# Patient Record
Sex: Female | Born: 1960
Health system: Southern US, Community
[De-identification: ages and names within clinical notes are randomized; demographics above are authoritative.]

## PROBLEM LIST (undated history)

## (undated) DIAGNOSIS — E7800 Pure hypercholesterolemia, unspecified: Secondary | ICD-10-CM

## (undated) DIAGNOSIS — Z860101 Personal history of adenomatous and serrated colon polyps: Secondary | ICD-10-CM

## (undated) DIAGNOSIS — F411 Generalized anxiety disorder: Secondary | ICD-10-CM

## (undated) DIAGNOSIS — R030 Elevated blood-pressure reading, without diagnosis of hypertension: Secondary | ICD-10-CM

## (undated) DIAGNOSIS — R1032 Left lower quadrant pain: Secondary | ICD-10-CM

## (undated) DIAGNOSIS — G479 Sleep disorder, unspecified: Secondary | ICD-10-CM

## (undated) DIAGNOSIS — F4321 Adjustment disorder with depressed mood: Secondary | ICD-10-CM

## (undated) DIAGNOSIS — E611 Iron deficiency: Secondary | ICD-10-CM

## (undated) DIAGNOSIS — Z86010 Personal history of colonic polyps: Secondary | ICD-10-CM

## (undated) DIAGNOSIS — E785 Hyperlipidemia, unspecified: Secondary | ICD-10-CM

## (undated) DIAGNOSIS — I10 Essential (primary) hypertension: Secondary | ICD-10-CM

## (undated) DIAGNOSIS — D649 Anemia, unspecified: Secondary | ICD-10-CM

## (undated) DIAGNOSIS — F4320 Adjustment disorder, unspecified: Secondary | ICD-10-CM

## (undated) DIAGNOSIS — E559 Vitamin D deficiency, unspecified: Secondary | ICD-10-CM

## (undated) DIAGNOSIS — R51 Headache: Secondary | ICD-10-CM

## (undated) DIAGNOSIS — F331 Major depressive disorder, recurrent, moderate: Secondary | ICD-10-CM

## (undated) DIAGNOSIS — F419 Anxiety disorder, unspecified: Secondary | ICD-10-CM

## (undated) DIAGNOSIS — K219 Gastro-esophageal reflux disease without esophagitis: Secondary | ICD-10-CM

## (undated) DIAGNOSIS — G2581 Restless legs syndrome: Secondary | ICD-10-CM

## (undated) DIAGNOSIS — Z973 Presence of spectacles and contact lenses: Secondary | ICD-10-CM

## (undated) HISTORY — PX: ABDOMINAL HYSTERECTOMY: SHX81

## (undated) HISTORY — DX: Generalized anxiety disorder: F41.1

## (undated) HISTORY — PX: COLONOSCOPY: SHX174

## (undated) HISTORY — DX: Essential (primary) hypertension: I10

## (undated) HISTORY — DX: Vitamin D deficiency, unspecified: E55.9

## (undated) HISTORY — DX: Pure hypercholesterolemia, unspecified: E78.00

## (undated) HISTORY — DX: Sleep disorder, unspecified: G47.9

## (undated) HISTORY — DX: Major depressive disorder, recurrent, moderate: F33.1

## (undated) HISTORY — DX: Adjustment disorder with depressed mood: F43.21

## (undated) HISTORY — DX: Adjustment disorder, unspecified: F43.20

---

## 1986-01-24 HISTORY — PX: DILATION AND CURETTAGE OF UTERUS: SHX78

## 1999-10-25 ENCOUNTER — Ambulatory Visit (HOSPITAL_COMMUNITY): Admission: RE | Admit: 1999-10-25 | Discharge: 1999-10-25 | Payer: Self-pay | Admitting: Gastroenterology

## 2001-05-28 ENCOUNTER — Other Ambulatory Visit: Admission: RE | Admit: 2001-05-28 | Discharge: 2001-05-28 | Payer: Self-pay | Admitting: Obstetrics and Gynecology

## 2002-06-05 ENCOUNTER — Other Ambulatory Visit: Admission: RE | Admit: 2002-06-05 | Discharge: 2002-06-05 | Payer: Self-pay | Admitting: Obstetrics and Gynecology

## 2003-06-26 ENCOUNTER — Other Ambulatory Visit: Admission: RE | Admit: 2003-06-26 | Discharge: 2003-06-26 | Payer: Self-pay | Admitting: Obstetrics and Gynecology

## 2004-08-25 ENCOUNTER — Encounter: Admission: RE | Admit: 2004-08-25 | Discharge: 2004-08-25 | Payer: Self-pay | Admitting: Occupational Medicine

## 2004-09-01 ENCOUNTER — Other Ambulatory Visit: Admission: RE | Admit: 2004-09-01 | Discharge: 2004-09-01 | Payer: Self-pay | Admitting: Obstetrics and Gynecology

## 2005-12-29 ENCOUNTER — Ambulatory Visit (HOSPITAL_COMMUNITY): Admission: RE | Admit: 2005-12-29 | Discharge: 2005-12-30 | Payer: Self-pay | Admitting: Obstetrics and Gynecology

## 2005-12-29 ENCOUNTER — Encounter (INDEPENDENT_AMBULATORY_CARE_PROVIDER_SITE_OTHER): Payer: Self-pay | Admitting: Specialist

## 2005-12-29 HISTORY — PX: LAPAROSCOPIC ASSISTED VAGINAL HYSTERECTOMY: SHX5398

## 2008-02-04 ENCOUNTER — Encounter: Admission: RE | Admit: 2008-02-04 | Discharge: 2008-02-04 | Payer: Self-pay | Admitting: Family Medicine

## 2010-01-12 ENCOUNTER — Encounter
Admission: RE | Admit: 2010-01-12 | Discharge: 2010-01-12 | Payer: Self-pay | Source: Home / Self Care | Attending: Family Medicine | Admitting: Family Medicine

## 2010-02-23 ENCOUNTER — Other Ambulatory Visit: Payer: Self-pay | Admitting: Obstetrics and Gynecology

## 2010-02-23 DIAGNOSIS — R928 Other abnormal and inconclusive findings on diagnostic imaging of breast: Secondary | ICD-10-CM

## 2010-02-26 ENCOUNTER — Ambulatory Visit
Admission: RE | Admit: 2010-02-26 | Discharge: 2010-02-26 | Disposition: A | Discharge status: Home / Self Care | Payer: BC Managed Care – PPO | Source: Ambulatory Visit | Attending: Obstetrics and Gynecology | Admitting: Obstetrics and Gynecology

## 2010-02-26 ENCOUNTER — Ambulatory Visit: Payer: Self-pay

## 2010-02-26 DIAGNOSIS — R928 Other abnormal and inconclusive findings on diagnostic imaging of breast: Secondary | ICD-10-CM

## 2010-06-11 NOTE — Discharge Summary (Signed)
NAME:  [name] [name], THIELKE             ACCOUNT NO.:  [id number]   MEDICAL RECORD NO.:  [id number]          PATIENT TYPE:  OIB   LOCATION:  9315                          FACILITY:  [facility]   PHYSICIAN:  Duke Salvia. Marcelle Overlie, M.D.DATE OF BIRTH:  Feb 02, 1960   DATE OF ADMISSION:  12/29/2005  DATE OF DISCHARGE:  12/30/2005                               DISCHARGE SUMMARY   DISCHARGE DIAGNOSES:  1. Leiomyoma with abnormal bleeding.  2. Laparoscopically assisted vaginal hysterectomy this admission.   HISTORY AND PHYSICAL EXAMINATION:  For summary of history and physical  examination, please see admission H and P for details.  Briefly, a 50-  year-old with leiomyoma causing significant menorrhagia presents for  hysterectomy.   HOSPITAL COURSE:  On December 29, 2005 under general anesthesia the  patient underwent laparoscopically assisted vaginal hysterectomy with  conservation of both ovaries.  Catheter was removed the following day.  She was afebrile, ambulating without difficulty and ready for discharge.  Preoperative hemoglobin was 12.8, postoperative hemoglobin on December 30, 2005 was 9.6.  A UPT was negative.  Blood type was O positive.   DISPOSITION:  Patient is discharged on Tylox p.r.n. pain.  Patient will  return to the office in one week.  She is advised to report any  incisional redness or drainage, increased pain, bleeding, fever over  101.  She was given specific instructions regarding diet, sex, exercise.   CONDITION ON DISCHARGE:  Good.   ACTIVITY:  Graded increase.      Richard M. Marcelle Overlie, M.D.  Electronically Signed     RMH/MEDQ  D:  12/30/2005  T:  12/30/2005  Job:  [id number]

## 2010-06-11 NOTE — Op Note (Signed)
NAME:  [name] [name], [name] [name]             ACCOUNT NO.:  [id number]   MEDICAL RECORD NO.:  [id number]          PATIENT TYPE:  AMB   LOCATION:  SDC                           [facility]  [facility]   PHYSICIAN:  Duke Salvia. Marcelle Overlie, M.D.DATE OF BIRTH:  July 19, 1960   DATE OF PROCEDURE:  12/29/2005  DATE OF DISCHARGE:                               OPERATIVE REPORT   PREOPERATIVE DIAGNOSIS:  Abnormal bleeding with leiomyoma.   POSTOPERATIVE DIAGNOSIS:  Abnormal bleeding with leiomyoma.   PROCEDURE:  Laparoscopically assisted vaginal hysterectomy.   SURGEON:  Duke Salvia. Marcelle Overlie, M.D.   ASSISTANT:  Juluis Mire, M.D.   ANESTHESIA:  General endotracheal.   COMPLICATIONS:  None.   DRAINS:  Foley catheter.   BLOOD LOSS:  400   SPECIMENS REMOVED:  Uterus.   PROCEDURE AND FINDINGS:  The patient was taken to the operating room and  after an adequate level of general endotracheal anesthesia obtained with  the patient's legs in stirrups, the abdomen, perineum and vagina were  prepped and draped in the usual manner for vaginal procedures.  The  bladder was drained, EUA carried out.  The uterus was 10-12 weeks' size,  midposition, mobile, adnexa negative.  Hulka tenaculum was positioned  and attention directed to the abdomen.  The subumbilical area was  infiltrated with 0.5% Marcaine plain.  A small incision was made; the  Veress needle was introduced without difficulty, its intra-abdominal  position verified by pressure and water testing.  After a 2.5-L  pneumoperitoneum was then created and laparoscopic trocar and sleeve  were then introduced without difficulty.  There was no evidence of any  bleeding or trauma.  Three fingerbreadths above the symphysis in the  midline, a 5-mm trocar was inserted under direct visualization.  The  patient was placed in Trendelenburg and the pelvic findings were as  follows:   The uterus itself was symmetrically enlarged, 10-12 weeks' size, adnexa  unremarkable,  cul-de-sac free and clear.  The gyrus PK instrument was  then used to coagulate and cut the utero-ovarian pedicles on each side  down to and including the round ligament on each side with excellent  hemostasis, conserving both normal ovaries.  The vaginal portion the  procedure was started at that point.   The legs were extended, weighted speculum was positioned, cervicovaginal  mucosa incised circumferentially, posterior culdotomy performed without  difficulty.  The gyrus handheld PK instrument was then used to coagulate  and divide the uterosacral ligaments.  The bladder was advanced  superiorly until the peritoneum could be identified.  This was entered  without difficulty and a retractor used to gently elevate the bladder  out of the field.  In sequential manner, the cardinal ligament and  uterine vasculature pedicles were clamped, coagulated and divided with  the gyrus PK instrument.  Morcellization was then carried out at that  point until the fundus could be delivered posteriorly.  Remaining  pedicles were clamped, divided and free-tied with 0 Vicryl ties.  The  cuff was then closed from 3 to 9 o'clock with a running locked 2-0  Vicryl suture.  A McCall culdoplasty  suture was then positioned, picking  up left uterosacral ligament and posterior peritoneum across to the  other uterosacral ligament, which was tied down for extra posterior  support.  Prior to closure, sponge, needle and instrument count were  reported correct x2.  The vaginal mucosa was then closed right-to-left  with interrupted 2-0 Monocryl sutures.  Foley catheter positioned,  draining clear urine.  Repeat laparoscopy revealed some minimal oozing  along the cuff, which was coagulated with the bipolar.  Repeat  irrigation and inspection revealed all operative sites be hemostatic.  The irrigant was suctioned out, instruments were removed, gas allowed to  escape, defects closed with 4-0 Dexon subcuticular sutures and   Dermabond.  She tolerated this well and went to recovery room in good  condition.      Richard M. Marcelle Overlie, M.D.  Electronically Signed     RMH/MEDQ  D:  12/29/2005  T:  12/29/2005  Job:  [id number]

## 2010-06-11 NOTE — H&P (Signed)
NAME:  [name] [name], [name] [name]             ACCOUNT NO.:  [id number]   MEDICAL RECORD NO.:  [id number]          PATIENT TYPE:  AMB   LOCATION:  SDC                           [facility]:  [facility]   PHYSICIAN:  Duke Salvia. Marcelle Overlie, M.D.DATE OF BIRTH:  01-Aug-1960   DATE OF ADMISSION:  12/29/2005  DATE OF DISCHARGE:                              HISTORY & PHYSICAL   CHIEF COMPLAINT:  Menorrhagia/leiomyoma.   HISTORY OF PRESENT ILLNESS:  This 50 year old G3, P2, currently using  OCPs and condoms for contraception, has problems with continued heavy  bleeding secondary to leiomyoma, presents now for definitive LAVH.  This  procedure, including risks of bleeding, infection, adjacent organ  injury, possible need for open or additional surgery, other  complications such as wound infection, phlebitis, along with her  expected recovery time, all discussed which she understands and accepts.   Most recent ultrasound on October 13, 2005 showed multiple uterine  fibroids, 2.7, 2.6 and 4.6 x 2.4 cm with a possibility of a 1.3 cm  intracavitary leiomyoma.  Adnexa unremarkable.   PAST MEDICAL HISTORY:   ALLERGIES:  PENICILLIN.   CURRENT MEDICATIONS:  OCPs.   She has had two vaginal deliveries at term, without complication.  One  prior D&C in 1988.   FAMILY HISTORY:  Significant for her father with heart disease,  otherwise unremarkable.   PHYSICAL EXAMINATION:  VITAL SIGNS:  Temperature 98.2, blood pressure  130/84.  HEENT:  Unremarkable.  NECK:  Supple without mass.  LUNGS:  Clear.  CARDIOVASCULAR:  Regular rate and rhythm without murmurs, rubs or  gallops.  BREASTS:  Without masses.  ABDOMEN:  Soft, flat and nontender.  PELVIC:  Normal external genitalia.  Vagina, introitus clear.  Uterus  upper limit of normal size.  Adnexa negative.  EXTREMITIES:  Unremarkable.  NEUROLOGIC:  Unremarkable   IMPRESSION:  Symptomatic leiomyoma.   PLAN:  LAVH procedure and risks reviewed as above.      Richard  M. Marcelle Overlie, M.D.  Electronically Signed     RMH/MEDQ  D:  12/28/2005  T:  12/28/2005  Job:  [id number]

## 2010-06-11 NOTE — Procedures (Signed)
[facility]  Patient:    [name] [name], [name] [name]                      MRN: [id number] Proc. Date: 10/25/99 Adm. Date:  [id number] Attending:  Rich Brave CC:         Chales Salmon. Abigail Miyamoto, M.D.  Darcella Cheshire, M.D.   Procedure Report  PROCEDURE PERFORMED:  Colonoscopy.  ENDOSCOPIST:  Florencia Reasons, M.D.  INDICATIONS FOR PROCEDURE:  The patient is a 50 year old three years status post removal of a medium-sized sigmoid polyp which was adenomatous in Editor, commissioning.  At that time she was having lower abdominal/pelvic pain which responded to hormone therapy (birth control pills) for control of a uterine fibroid which is the presumed source of symptoms, in that she has had a very good therapeutic response to that intervention.  FINDINGS:  Normal colonoscopy to the terminal ileum.  DESCRIPTION OF PROCEDURE:  The nature, purpose and risks of the procedure were familiar to the patient from prior examination and she provided written consent.  Sedation was fentanyl 100 mcg and Versed 7 mg IV without arrhythmias or desaturation.  The Olympus adjustable tension, pediatric video colonoscope was advanced to the terminal ileum without significant difficulty.  The terminal ileum had a normal appearance.  Pullback was performed.  The quality of the prep was excellent and it is felt that all areas were well seen.  This was a normal examination.  There was no evidence of residual or recurrent polyps including a repeat examination of the rectosigmoid area. There was no evidence of polyps anywhere in the colon or any cancer, colitis, vascular malformations or diverticular disease.  Retroflexion in the rectum was unremarkable.  No biopsies were obtained.  The patient tolerated the procedure well and there were no apparent complications.  IMPRESSION:  Normal colonoscopy to the terminal ileum.  Prior history of colonic adenoma.  PLAN:  Follow-up colonoscopy  in  five years because of the history of previous adenomatous polyp having been removed. DD:  10/25/99 TD:  10/25/99 Job: [id number]

## 2011-03-08 ENCOUNTER — Other Ambulatory Visit: Payer: Self-pay | Admitting: Obstetrics and Gynecology

## 2011-12-05 ENCOUNTER — Encounter (HOSPITAL_COMMUNITY): Payer: Self-pay

## 2011-12-05 ENCOUNTER — Encounter (HOSPITAL_COMMUNITY)
Admission: RE | Admit: 2011-12-05 | Discharge: 2011-12-05 | Disposition: A | Discharge status: Home / Self Care | Payer: BC Managed Care – PPO | Source: Ambulatory Visit | Attending: Obstetrics and Gynecology | Admitting: Obstetrics and Gynecology

## 2011-12-05 HISTORY — DX: Headache: R51

## 2011-12-05 HISTORY — DX: Anxiety disorder, unspecified: F41.9

## 2011-12-05 HISTORY — DX: Gastro-esophageal reflux disease without esophagitis: K21.9

## 2011-12-05 LAB — CBC
HCT: 38 % (ref 36.0–46.0)
Hemoglobin: 12.9 g/dL (ref 12.0–15.0)
MCH: 30.4 pg (ref 26.0–34.0)
MCHC: 33.9 g/dL (ref 30.0–36.0)
MCV: 89.6 fL (ref 78.0–100.0)
Platelets: 282 10*3/uL (ref 150–400)
RBC: 4.24 MIL/uL (ref 3.87–5.11)
RDW: 13 % (ref 11.5–15.5)
WBC: 10.9 10*3/uL — ABNORMAL HIGH (ref 4.0–10.5)

## 2011-12-05 NOTE — Patient Instructions (Addendum)
   Your procedure is scheduled on: Monday November 18th  Enter through the Main Entrance of [facility] at:6am Pick up the phone at the desk and dial [phone] and inform us of your arrival.  Please call this number if you have any problems the morning of surgery: [phone]  Remember: Do not eat or drink anything after midnight on Sunday Please take your protonix morning of surgery with sips of water, you may take your Lorazepam if needed.   Do not wear jewelry, make-up, or FINGER nail polish No metal in your hair or on your body. Do not wear lotions, powders, perfumes. You may wear deodorant.  Please use your CHG wash as directed prior to surgery.  Do not shave anywhere for at least 12 hours prior to first CHG shower.  Do not bring valuables to the hospital. Please bring a case to protect your eyeglasses.    Patients discharged on the day of surgery will not be allowed to drive home.

## 2011-12-06 NOTE — H&P (Signed)
[name] [name]  DICTATION # [id number] CSN# [id number]   Meriel Pica, MD 12/06/2011 10:36 AM

## 2011-12-07 NOTE — H&P (Signed)
NAMEETTEL, [name] NO.:  [id number]  MEDICAL RECORD NO.:  [id number]  LOCATION:  PERIO                         FACILITY:  [facility]  PHYSICIAN:  Duke Salvia. Marcelle Overlie, M.D.DATE OF BIRTH:  1960/11/03  DATE OF ADMISSION:  10/06/2011 DATE OF DISCHARGE:                             HISTORY & PHYSICAL   CHIEF COMPLAINT:  Stress urinary incontinence.  HISTORY OF PRESENT ILLNESS:  A 51 year old, G3, P2, prior LAVH, and the patient has a several-year history of issue that was not respond to Kegel exercises.  Urodynamic evaluation in our office showed that her PVR was less than 30 mL with a normal UA.  She did leak with cough and Valsalva.  Voiding urogram was normal with a LPP ranging from 61-88. She presents now for single incision sling.  This procedure including risks related to bleeding, infection, adjacent organ injury, possible need for catheter postoperatively, mesh exposure, and its treatment reviewed with her, which she understands and accepts.  PAST MEDICAL HISTORY:  ALLERGIES:  PENICILLIN.  CURRENT MEDICATIONS:  Protonix and vitamin supplements.  PAST SURGICAL HISTORY:  Two vaginal deliveries.  Prior LAVH.  REVIEW OF SYSTEMS:  Otherwise negative.  FAMILY HISTORY:  Significant for unspecified heart disease.  SOCIAL HISTORY:  Denies tobacco or drug use.  She does consume alcohol socially.  She is married.  Dr. Tenny Craw is her PCP.  PHYSICAL EXAMINATION:  VITAL SIGNS:  Temperature 98.2, blood pressure 120/78. HEENT:  Unremarkable. NECK:  Supple without masses. LUNGS:  Clear. CARDIOVASCULAR:  Regular rate and rhythm without murmurs, rubs, or gallops. BREASTS:  Without masses. ABDOMEN:  Soft, flat, nontender. PELVIC:  Normal external genitalia.  Vagina unremarkable.  The uterus and cervix surgically absent.  Bimanual negative. EXTREMITIES:  Unremarkable. NEUROLOGIC:  Unremarkable.  IMPRESSION:  Stress urinary incontinence.  PLAN:  Single incision sling,  procedure and risks were discussed as above.     Gerline Ratto M. Marcelle Overlie, M.D.     RMH/MEDQ  D:  12/06/2011  T:  12/07/2011  Job:  [id number]

## 2011-12-11 MED ORDER — DEXTROSE 5 % IV SOLN
INTRAVENOUS | Status: AC
  Administered 2011-12-12: 100 mL via INTRAVENOUS
  Filled 2011-12-11: qty 8.68

## 2011-12-12 ENCOUNTER — Encounter (HOSPITAL_COMMUNITY): Payer: Self-pay | Admitting: Anesthesiology

## 2011-12-12 ENCOUNTER — Ambulatory Visit (HOSPITAL_COMMUNITY)
Admission: RE | Admit: 2011-12-12 | Discharge: 2011-12-12 | Disposition: A | Discharge status: Home / Self Care | Payer: BC Managed Care – PPO | Source: Ambulatory Visit | Attending: Obstetrics and Gynecology | Admitting: Obstetrics and Gynecology

## 2011-12-12 ENCOUNTER — Encounter (HOSPITAL_COMMUNITY)
Admission: RE | Disposition: A | Discharge status: Home / Self Care | Payer: Self-pay | Source: Ambulatory Visit | Attending: Obstetrics and Gynecology

## 2011-12-12 ENCOUNTER — Ambulatory Visit (HOSPITAL_COMMUNITY): Payer: BC Managed Care – PPO | Admitting: Anesthesiology

## 2011-12-12 DIAGNOSIS — N393 Stress incontinence (female) (male): Secondary | ICD-10-CM | POA: Insufficient documentation

## 2011-12-12 HISTORY — PX: CYSTOSCOPY: SHX5120

## 2011-12-12 HISTORY — PX: PUBOVAGINAL SLING: SHX1035

## 2011-12-12 SURGERY — CREATION, PUBOVAGINAL SLING
Anesthesia: General | Site: Perineum | Wound class: Clean Contaminated

## 2011-12-12 MED ORDER — STERILE WATER FOR IRRIGATION IR SOLN
Status: DC | PRN
  Administered 2011-12-12: 1000 mL via INTRAVESICAL

## 2011-12-12 MED ORDER — KETOROLAC TROMETHAMINE 30 MG/ML IJ SOLN: 15.0000 mg | Freq: Once | INTRAMUSCULAR | Status: DC | PRN

## 2011-12-12 MED ORDER — PROPOFOL 10 MG/ML IV EMUL
INTRAVENOUS | Status: DC | PRN
  Administered 2011-12-12: 200 mg via INTRAVENOUS

## 2011-12-12 MED ORDER — GLYCOPYRROLATE 0.2 MG/ML IJ SOLN
INTRAMUSCULAR | Status: AC
  Filled 2011-12-12: qty 2

## 2011-12-12 MED ORDER — HYDROCODONE-IBUPROFEN 7.5-200 MG PO TABS: 1.0000 | ORAL_TABLET | Freq: Three times a day (TID) | ORAL | Status: DC | PRN

## 2011-12-12 MED ORDER — PROPOFOL 10 MG/ML IV EMUL
INTRAVENOUS | Status: AC
  Filled 2011-12-12: qty 20

## 2011-12-12 MED ORDER — LIDOCAINE HCL (CARDIAC) 20 MG/ML IV SOLN
INTRAVENOUS | Status: AC
  Filled 2011-12-12: qty 5

## 2011-12-12 MED ORDER — ROCURONIUM BROMIDE 100 MG/10ML IV SOLN
INTRAVENOUS | Status: DC | PRN
  Administered 2011-12-12: 30 mg via INTRAVENOUS

## 2011-12-12 MED ORDER — MIDAZOLAM HCL 5 MG/5ML IJ SOLN
INTRAMUSCULAR | Status: DC | PRN
  Administered 2011-12-12: 2 mg via INTRAVENOUS

## 2011-12-12 MED ORDER — LIDOCAINE-EPINEPHRINE 1 %-1:100000 IJ SOLN
INTRAMUSCULAR | Status: DC | PRN
  Administered 2011-12-12: 3 mL

## 2011-12-12 MED ORDER — KETOROLAC TROMETHAMINE 30 MG/ML IJ SOLN
INTRAMUSCULAR | Status: AC
  Filled 2011-12-12: qty 1

## 2011-12-12 MED ORDER — LACTATED RINGERS IV SOLN
INTRAVENOUS | Status: DC
  Administered 2011-12-12 (×2): via INTRAVENOUS

## 2011-12-12 MED ORDER — MIDAZOLAM HCL 2 MG/2ML IJ SOLN
INTRAMUSCULAR | Status: AC
  Filled 2011-12-12: qty 2

## 2011-12-12 MED ORDER — FENTANYL CITRATE 0.05 MG/ML IJ SOLN: 25.0000 ug | INTRAMUSCULAR | Status: DC | PRN

## 2011-12-12 MED ORDER — FENTANYL CITRATE 0.05 MG/ML IJ SOLN
INTRAMUSCULAR | Status: AC
  Filled 2011-12-12: qty 5

## 2011-12-12 MED ORDER — FENTANYL CITRATE 0.05 MG/ML IJ SOLN
INTRAMUSCULAR | Status: DC | PRN
  Administered 2011-12-12: 50 ug via INTRAVENOUS
  Administered 2011-12-12: 100 ug via INTRAVENOUS

## 2011-12-12 MED ORDER — ONDANSETRON HCL 4 MG/2ML IJ SOLN
INTRAMUSCULAR | Status: AC
  Filled 2011-12-12: qty 2

## 2011-12-12 MED ORDER — LIDOCAINE HCL (CARDIAC) 20 MG/ML IV SOLN
INTRAVENOUS | Status: DC | PRN
  Administered 2011-12-12: 80 mg via INTRAVENOUS

## 2011-12-12 MED ORDER — KETOROLAC TROMETHAMINE 30 MG/ML IJ SOLN
INTRAMUSCULAR | Status: DC | PRN
  Administered 2011-12-12: 30 mg via INTRAVENOUS

## 2011-12-12 MED ORDER — NEOSTIGMINE METHYLSULFATE 1 MG/ML IJ SOLN
INTRAMUSCULAR | Status: AC
  Filled 2011-12-12: qty 10

## 2011-12-12 MED ORDER — ESTRADIOL 0.1 MG/GM VA CREA
TOPICAL_CREAM | VAGINAL | Status: AC
  Filled 2011-12-12: qty 42.5

## 2011-12-12 MED ORDER — NEOSTIGMINE METHYLSULFATE 1 MG/ML IJ SOLN
INTRAMUSCULAR | Status: DC | PRN
  Administered 2011-12-12: 3 mg via INTRAVENOUS

## 2011-12-12 MED ORDER — GLYCOPYRROLATE 0.2 MG/ML IJ SOLN
INTRAMUSCULAR | Status: DC | PRN
  Administered 2011-12-12: 0.4 mg via INTRAVENOUS

## 2011-12-12 MED ORDER — ONDANSETRON HCL 4 MG/2ML IJ SOLN
INTRAMUSCULAR | Status: DC | PRN
  Administered 2011-12-12: 4 mg via INTRAVENOUS

## 2011-12-12 SURGICAL SUPPLY — 19 items
BLADE SURG 15 STRL LF C SS BP (BLADE) IMPLANT
BLADE SURG 15 STRL SS (BLADE) ×3
CANISTER SUCTION 2500CC (MISCELLANEOUS) ×3 IMPLANT
CLOTH BEACON ORANGE TIMEOUT ST (SAFETY) ×3 IMPLANT
DECANTER SPIKE VIAL GLASS SM (MISCELLANEOUS) ×3 IMPLANT
GAUZE PACKING 1 X5 YD ST (GAUZE/BANDAGES/DRESSINGS) IMPLANT
GLOVE BIO SURGEON STRL SZ7 (GLOVE) ×6 IMPLANT
GOWN PREVENTION PLUS LG XLONG (DISPOSABLE) ×12 IMPLANT
NDL HYPO 25X1 1.5 SAFETY (NEEDLE) ×2 IMPLANT
NEEDLE HYPO 25X1 1.5 SAFETY (NEEDLE) ×3 IMPLANT
NS IRRIG 1000ML POUR BTL (IV SOLUTION) ×3 IMPLANT
PACK VAGINAL WOMENS (CUSTOM PROCEDURE TRAY) ×3 IMPLANT
SET CYSTO W/LG BORE CLAMP LF (SET/KITS/TRAYS/PACK) ×3 IMPLANT
SUT VIC AB 2-0 UR6 27 (SUTURE) ×2 IMPLANT
SYR CONTROL 10ML LL (SYRINGE) ×3 IMPLANT
Solyx SIS System ×1 IMPLANT
TOWEL OR 17X24 6PK STRL BLUE (TOWEL DISPOSABLE) ×6 IMPLANT
TRAY FOLEY CATH 14FR (SET/KITS/TRAYS/PACK) ×3 IMPLANT
WATER STERILE IRR 1000ML POUR (IV SOLUTION) ×3 IMPLANT

## 2011-12-12 NOTE — Op Note (Signed)
Preoperative diagnosis: Stress urinary incontinence  Postoperative diagnosis: Same  Procedure: Solix, single incision mid urethral sling, cystoscopy  Surgeon: Marcelle Overlie  Drains: Foley catheter  Blood loss less than 50 cc  Complications: None  Procedure and findings:  Patient taken the operating room after an adequate level of general anesthesia was obtained the patient's legs in stirrups the perineum and vagina were prepped and draped in usual fashion for vaginal procedures catheter used to drain the bladder appropriate timeout for taken at that point. Weighted speculum was positioned, EUA was carried out. She had a prior LAVH. The mid urethral mucosa was tented between 2 Allis clamps, dilute Xylocaine with epinephrine was then injected into the surgical site a 2 cm vertical incision was made with minimal Metzenbaum scissor dissection, the surgeon's finger was used to dissect the along the posterior margin of the inferior pubic ramus on each side. The Solix sling was then positioned at 45 angle on the right side to the midpoint as per protocol the anchor was released. This is reloaded in position on the left side posterior to the inferior pubic ramus at a 45 angle. Final tensioning to place with the mesh lying just snug to the mid urethral area and the anchor was released. Cystoscopy was carried out which was normal. The vaginal mucosal incision was then closed with interrupted 2-0 Vicryl sutures. This was hemostatic she tolerated this well went to recovery room in good condition.  Dictated with [facility]  [name] [name] M. [place]

## 2011-12-12 NOTE — Anesthesia Postprocedure Evaluation (Signed)
  Anesthesia Post-op Note  Patient: [name] [name]  Procedure(s) Performed: Procedure(s) (LRB) with comments: PUBO-VAGINAL SLING (N/A) - Solyx Single Incision Sling CYSTOSCOPY (N/A)  Patient Location: PACU  Anesthesia Type:General  Level of Consciousness: awake, alert  and oriented  Airway and Oxygen Therapy: Patient Spontanous Breathing  Post-op Pain: mild  Post-op Assessment: Post-op Vital signs reviewed, Patient's Cardiovascular Status Stable, Respiratory Function Stable, Patent Airway, No signs of Nausea or vomiting and Pain level controlled  Post-op Vital Signs: Reviewed and stable  Complications: No apparent anesthesia complications

## 2011-12-12 NOTE — Progress Notes (Signed)
The patient was re-examined with no change in status 

## 2011-12-12 NOTE — Anesthesia Preprocedure Evaluation (Addendum)
Anesthesia Evaluation  Patient identified by MRN, date of birth, ID band Patient awake    Reviewed: Allergy & Precautions, H&P , NPO status , Patient's Chart, lab work & pertinent test results, reviewed documented beta blocker date and time   History of Anesthesia Complications Negative for: history of anesthetic complications  Airway Mallampati: I TM Distance: >3 FB Neck ROM: full    Dental  (+) Teeth Intact and Caps,    Pulmonary neg pulmonary ROS,  breath sounds clear to auscultation  Pulmonary exam normal       Cardiovascular negative cardio ROS  Rhythm:regular Rate:Normal     Neuro/Psych PSYCHIATRIC DISORDERS (anxiety) negative neurological ROS     GI/Hepatic Neg liver ROS, GERD-  Medicated,  Endo/Other  negative endocrine ROS  Renal/GU negative Renal ROS Bladder dysfunction      Musculoskeletal   Abdominal   Peds  Hematology negative hematology ROS (+)   Anesthesia Other Findings Cold sore - left corner of mouth - lubricate before/after intubation  Reproductive/Obstetrics negative OB ROS                          Anesthesia Physical Anesthesia Plan  ASA: II  Anesthesia Plan: General ETT   Post-op Pain Management:    Induction:   Airway Management Planned:   Additional Equipment:   Intra-op Plan:   Post-operative Plan:   Informed Consent: I have reviewed the patients History and Physical, chart, labs and discussed the procedure including the risks, benefits and alternatives for the proposed anesthesia with the patient or authorized representative who has indicated his/her understanding and acceptance.   Dental Advisory Given  Plan Discussed with: CRNA and Surgeon  Anesthesia Plan Comments:         Anesthesia Quick Evaluation

## 2011-12-12 NOTE — Transfer of Care (Signed)
Immediate Anesthesia Transfer of Care Note  Patient: [name] [name]  Procedure(s) Performed: Procedure(s) (LRB) with comments: PUBO-VAGINAL SLING (N/A) - Solyx Single Incision Sling CYSTOSCOPY (N/A)  Patient Location: PACU  Anesthesia Type:General  Level of Consciousness: awake, alert  and oriented  Airway & Oxygen Therapy: Patient Spontanous Breathing and Patient connected to nasal cannula oxygen  Post-op Assessment: Report given to PACU RN, Post -op Vital signs reviewed and stable and Patient moving all extremities  Post vital signs: stable  Complications: No apparent anesthesia complications

## 2011-12-13 ENCOUNTER — Encounter (HOSPITAL_COMMUNITY): Payer: Self-pay | Admitting: Obstetrics and Gynecology

## 2012-10-24 ENCOUNTER — Ambulatory Visit (INDEPENDENT_AMBULATORY_CARE_PROVIDER_SITE_OTHER): Payer: BC Managed Care – PPO | Admitting: Diagnostic Neuroimaging

## 2012-10-24 ENCOUNTER — Encounter: Payer: Self-pay | Admitting: Diagnostic Neuroimaging

## 2012-10-24 VITALS — BP 133/87 | HR 91 | Temp 98.3°F | Ht 65.0 in | Wt 150.0 lb

## 2012-10-24 DIAGNOSIS — G2581 Restless legs syndrome: Secondary | ICD-10-CM | POA: Insufficient documentation

## 2012-10-24 MED ORDER — ROTIGOTINE 1 MG/24HR TD PT24: 1.0000 | MEDICATED_PATCH | Freq: Every day | TRANSDERMAL | Status: DC

## 2012-10-24 NOTE — Progress Notes (Signed)
[facility]  PATIENT: [name] [name] DOB: [birth year]  REFERRING CLINICIAN: Tenny Craw HISTORY FROM: patient REASON FOR VISIT: new consult   HISTORICAL  CHIEF COMPLAINT:  Chief Complaint  Patient presents with  . Peripheral Neuropathy    Legs    HISTORY OF PRESENT ILLNESS:   52 year old right-handed female here for evaluation of leg pain. In patient reports pain in her legs, throughout, aching and sore feeling, which is worse when she is walking. Spent or she sits down. However later in the evening she feels restless and has the urge to move her legs. In the morning when she wakes up her legs are quite stiff. Symptoms are going on for past 6-8 months. Patient tried Lyrica 50 mg at bedtime, tried 100 mg dose but could not tolerate it. Patient was diagnosed with low iron levels and is on replacement for past one month.  REVIEW OF SYSTEMS: Full 14 system review of systems performed and notable only for restless legs anxiety joint pain aching muscles.  ALLERGIES: Allergies  Allergen Reactions  . Penicillins Other (See Comments)    Childhood reaction    HOME MEDICATIONS: Prior to Admission medications   Medication Sig Start Date End Date Taking? Authorizing Provider  b complex vitamins tablet Take 1 tablet by mouth daily.   Yes Historical Provider, MD  calcium-vitamin D (OSCAL WITH D) 500-200 MG-UNIT per tablet Take 1 tablet by mouth daily.   Yes Historical Provider, MD  Garlic 1000 MG CAPS Take 1 capsule by mouth daily.   Yes Historical Provider, MD  HYDROcodone-ibuprofen (VICOPROFEN) 7.5-200 MG per tablet Take 1 tablet by mouth every 8 (eight) hours as needed for pain. 12/12/11  Yes Meriel Pica, MD  ibuprofen (ADVIL,MOTRIN) 200 MG tablet Take 400-600 mg by mouth every 8 (eight) hours as needed. For headache   Yes Historical Provider, MD  LORazepam (ATIVAN) 0.5 MG tablet Take 0.5 mg by mouth every 8 (eight) hours as needed. Pt takes half tablet for anxiety.    Yes Historical Provider, MD  pantoprazole (PROTONIX) 40 MG tablet Take 40 mg by mouth 2 (two) times daily.   Yes Historical Provider, MD  ST JOHNS WORT PO Take 1 capsule by mouth daily.   Yes Historical Provider, MD  vitamin C (ASCORBIC ACID) 500 MG tablet Take 500 mg by mouth daily.   Yes Historical Provider, MD  Rotigotine (NEUPRO) 1 MG/24HR PT24 Place 1 patch (1 mg total) onto the skin daily. Change patch and rotate sites daily. 10/24/12   Suanne Marker, MD   Outpatient Prescriptions Prior to Visit  Medication Sig Dispense Refill  . b complex vitamins tablet Take 1 tablet by mouth daily.      . calcium-vitamin D (OSCAL WITH D) 500-200 MG-UNIT per tablet Take 1 tablet by mouth daily.      . Garlic 1000 MG CAPS Take 1 capsule by mouth daily.      Marland Kitchen HYDROcodone-ibuprofen (VICOPROFEN) 7.5-200 MG per tablet Take 1 tablet by mouth every 8 (eight) hours as needed for pain.  30 tablet  0  . ibuprofen (ADVIL,MOTRIN) 200 MG tablet Take 400-600 mg by mouth every 8 (eight) hours as needed. For headache      . LORazepam (ATIVAN) 0.5 MG tablet Take 0.5 mg by mouth every 8 (eight) hours as needed. Pt takes half tablet for anxiety.      . pantoprazole (PROTONIX) 40 MG tablet Take 40 mg by mouth 2 (two) times daily.      Marland Kitchen  ST JOHNS WORT PO Take 1 capsule by mouth daily.      . vitamin C (ASCORBIC ACID) 500 MG tablet Take 500 mg by mouth daily.      . Cholecalciferol (VITAMIN D3) 2000 UNITS TABS Take 2 tablets by mouth daily.       No facility-administered medications prior to visit.    PAST MEDICAL HISTORY: Past Medical History  Diagnosis Date  . GERD (gastroesophageal reflux disease)   . Headache(784.0)   . Anxiety     PAST SURGICAL HISTORY: Past Surgical History  Procedure Laterality Date  . Abdominal hysterectomy    . Colonoscopy    . Pubovaginal sling  12/12/2011    Procedure: Leonides Grills;  Surgeon: Meriel Pica, MD;  Location: [facility];  Service: Gynecology;  Laterality: N/A;   Solyx Single Incision Sling  . Cystoscopy  12/12/2011    Procedure: CYSTOSCOPY;  Surgeon: Meriel Pica, MD;  Location: [facility];  Service: Gynecology;  Laterality: N/A;    FAMILY HISTORY: No family history on file.  SOCIAL HISTORY:  History   Social History  . Marital Status: Married    Spouse Name: N/A    Number of Children: N/A  . Years of Education: N/A   Occupational History  . Not on file.   Social History Main Topics  . Smoking status: Never Smoker   . Smokeless tobacco: Not on file  . Alcohol Use: Yes     Comment: occasionally  . Drug Use: No  . Sexual Activity:    Other Topics Concern  . Not on file   Social History Narrative  . No narrative on file     PHYSICAL EXAM  Filed Vitals:   10/24/12 0851  BP: 133/87  Pulse: 91  Temp: 98.3 F (36.8 C)  TempSrc: Oral  Height: 5\' 5"  (1.651 m)  Weight: 150 lb (68.04 kg)    Not recorded    Body mass index is 24.96 kg/(m^2).  GENERAL EXAM: Patient is in no distress  CARDIOVASCULAR: Regular rate and rhythm, no murmurs, no carotid bruits  NEUROLOGIC: MENTAL STATUS: awake, alert, language fluent, comprehension intact, naming intact CRANIAL NERVE: no papilledema on fundoscopic exam, pupils equal and reactive to light, visual fields full to confrontation, extraocular muscles intact, no nystagmus, facial sensation and strength symmetric, uvula midline, shoulder shrug symmetric, tongue midline. MOTOR: normal bulk and tone, full strength in the BUE, BLE SENSORY: normal and symmetric to light touch, pinprick, temperature, vibration COORDINATION: finger-nose-finger, fine finger movements normal REFLEXES: deep tendon reflexes present and symmetric GAIT/STATION: narrow based gait; able to walk on toes, heels and tandem; romberg is negative   DIAGNOSTIC DATA (LABS, IMAGING, TESTING) - I reviewed patient records, labs, notes, testing and imaging myself where available.  Lab Results  Component Value Date    WBC 10.9* 12/05/2011   HGB 12.9 12/05/2011   HCT 38.0 12/05/2011   MCV 89.6 12/05/2011   PLT 282 12/05/2011   No results found for this basename: na, k, cl, co2, glucose, bun, creatinine, calcium, prot, albumin, ast, alt, alkphos, bilitot, gfrnonaa, gfraa   No results found for this basename: CHOL, HDL, LDLCALC, LDLDIRECT, TRIG, CHOLHDL   No results found for this basename: HGBA1C   No results found for this basename: VITAMINB12   No results found for this basename: TSH     ASSESSMENT AND PLAN  52 y.o. year old female here with aching leg pain, suspicious for restless leg syndrome. Neuro exam normal. Couldn't tolerate higher  lyrica dosing. Will try neupro patches.  Meds ordered this encounter  Medications  . Rotigotine (NEUPRO) 1 MG/24HR PT24    Sig: Place 1 patch (1 mg total) onto the skin daily. Change patch and rotate sites daily.    Dispense:  30 patch    Refill:  12   Return in about 2 months (around 12/24/2012) for with Heide Guile or Penumalli.    Suanne Marker, MD 10/24/2012, 9:37 AM Certified in Neurology, Neurophysiology and Neuroimaging  [facility] [address] [place], [place] [address] [phone]

## 2012-12-11 ENCOUNTER — Telehealth: Payer: Self-pay | Admitting: Diagnostic Neuroimaging

## 2012-12-13 NOTE — Telephone Encounter (Signed)
Patient states Neupro 1 mg is working. Since the patch is working, she has been more active. She is very happy she is able to be more active. She has noticed her legs are hurting more at night with increased activity. Requesting to know if Neupro patch dosage should be increased.

## 2012-12-13 NOTE — Telephone Encounter (Signed)
Spoke to patient. Informed. Patient agreed.

## 2012-12-13 NOTE — Telephone Encounter (Signed)
For restless leg syndrome the Neupro patch can certainly be increased to up to 3 mg. However I would increase this cautiously and since she has just recently started this medication I would at least keep this at the same dose until she comes back for her appointment. She has an appointment with the nurse practitioner coming up in less than 2 weeks at which time they can consider increasing it to 2 mg.

## 2012-12-24 ENCOUNTER — Ambulatory Visit (INDEPENDENT_AMBULATORY_CARE_PROVIDER_SITE_OTHER): Payer: BC Managed Care – PPO | Admitting: Nurse Practitioner

## 2012-12-24 ENCOUNTER — Encounter: Payer: Self-pay | Admitting: Nurse Practitioner

## 2012-12-24 ENCOUNTER — Encounter (INDEPENDENT_AMBULATORY_CARE_PROVIDER_SITE_OTHER): Payer: Self-pay

## 2012-12-24 VITALS — BP 143/98 | HR 90 | Temp 98.5°F | Ht 65.5 in | Wt 160.0 lb

## 2012-12-24 DIAGNOSIS — G2581 Restless legs syndrome: Secondary | ICD-10-CM

## 2012-12-24 MED ORDER — ROTIGOTINE 2 MG/24HR TD PT24: 1.0000 | MEDICATED_PATCH | Freq: Every day | TRANSDERMAL | Status: DC

## 2012-12-24 NOTE — Progress Notes (Signed)
PATIENT: [name] [name] DOB: 11-28-1960   REASON FOR VISIT: follow up for restless legs HISTORY FROM: patient  HISTORY OF PRESENT ILLNESS: 10/24/12 (VP):  52 year old right-handed female here for evaluation of leg pain. In patient reports pain in her legs, throughout, aching and sore feeling, which is worse when she is walking. Spent or she sits down. However later in the evening she feels restless and has the urge to move her legs. In the morning when she wakes up her legs are quite stiff. Symptoms are going on for past 6-8 months. Patient tried Lyrica 50 mg at bedtime, tried 100 mg dose but could not tolerate it. Patient was diagnosed with low iron levels and is on replacement for past one month.   12/24/12 (LL):  Patient states Neupro 1 mg is working. Since the patch is working, she has been more active. She is very happy she is able to be more active. She has noticed her legs are hurting more at night with increased activity. Requesting to know if Neupro patch dosage should be increased.    REVIEW OF SYSTEMS: Full 14 system review of systems performed and notable only for restless legs, aching muscles.   ALLERGIES: Allergies  Allergen Reactions  . Penicillins Other (See Comments)    Childhood reaction    HOME MEDICATIONS: Outpatient Prescriptions Prior to Visit  Medication Sig Dispense Refill  . b complex vitamins tablet Take 1 tablet by mouth daily.      . calcium-vitamin D (OSCAL WITH D) 500-200 MG-UNIT per tablet Take 1 tablet by mouth daily.      . Garlic 1000 MG CAPS Take 1 capsule by mouth daily.      Marland Kitchen HYDROcodone-ibuprofen (VICOPROFEN) 7.5-200 MG per tablet Take 1 tablet by mouth every 8 (eight) hours as needed for pain.  30 tablet  0  . ibuprofen (ADVIL,MOTRIN) 200 MG tablet Take 400-600 mg by mouth every 8 (eight) hours as needed. For headache      . LORazepam (ATIVAN) 0.5 MG tablet Take 0.5 mg by mouth every 8 (eight) hours as needed. Pt takes half tablet for  anxiety.      . pantoprazole (PROTONIX) 40 MG tablet Take 40 mg by mouth 2 (two) times daily.      . ST JOHNS WORT PO Take 1 capsule by mouth daily.      . vitamin C (ASCORBIC ACID) 500 MG tablet Take 500 mg by mouth daily.      . Rotigotine (NEUPRO) 1 MG/24HR PT24 Place 1 patch (1 mg total) onto the skin daily. Change patch and rotate sites daily.  30 patch  12   No facility-administered medications prior to visit.    PAST MEDICAL HISTORY: Past Medical History  Diagnosis Date  . GERD (gastroesophageal reflux disease)   . Headache(784.0)   . Anxiety     PAST SURGICAL HISTORY: Past Surgical History  Procedure Laterality Date  . Abdominal hysterectomy    . Colonoscopy    . Pubovaginal sling  12/12/2011    Procedure: Leonides Grills;  Surgeon: Meriel Pica, MD;  Location: [facility];  Service: Gynecology;  Laterality: N/A;  Solyx Single Incision Sling  . Cystoscopy  12/12/2011    Procedure: CYSTOSCOPY;  Surgeon: Meriel Pica, MD;  Location: [facility];  Service: Gynecology;  Laterality: N/A;    FAMILY HISTORY: Family History  Problem Relation Age of Onset  . Heart attack Brother     SOCIAL HISTORY: History   Social History  .  Marital Status: Married    Spouse Name: Jena Gauss    Number of Children: 2  . Years of Education: College   Occupational History  . CNA    Social History Main Topics  . Smoking status: Never Smoker   . Smokeless tobacco: Never Used  . Alcohol Use: Yes     Comment: occasionally  . Drug Use: No  . Sexual Activity: Not on file   Other Topics Concern  . Not on file   Social History Narrative   Patient lives at home with family.   Caffeine Use: 5 cups of tea daily     PHYSICAL EXAM  Filed Vitals:   12/24/12 0946  BP: 143/98  Pulse: 90  Temp: 98.5 F (36.9 C)  TempSrc: Oral  Height: 5' 5.5" (1.664 m)  Weight: 160 lb (72.576 kg)   Body mass index is 26.21 kg/(m^2).  Generalized: Well developed, in no acute distress  Head:  normocephalic and atraumatic. Oropharynx benign  Neck: Supple, no carotid bruits  Cardiac: Regular rate rhythm, no murmur  Musculoskeletal: No deformity   Neurological examination  Mentation: Alert oriented to time, place, history taking. Follows all commands speech and language fluent Cranial nerve II-XII:   Pupils were equal round reactive to light extraocular movements were full, visual field were full on confrontational test. Facial sensation and strength were normal. hearing was intact to finger rubbing bilaterally. Uvula tongue midline. head turning and shoulder shrug and were normal and symmetric.Tongue protrusion into cheek strength was normal. Motor: normal bulk and tone, full strength in the BUE, BLE, fine finger movements normal, no pronator drift. No focal weakness Sensory: normal and symmetric to light touch, pinprick, and  vibration  Coordination: finger-nose-finger, heel-to-shin bilaterally, no dysmetria Reflexes:  Deep tendon reflexes in the upper and lower extremities are present and symmetric.  Gait and Station: Rising up from seated position without assistance, normal stance, without trunk ataxia, moderate stride, good arm swing, smooth turning, able to perform tiptoe, and heel walking without difficulty.   DIAGNOSTIC DATA (LABS, IMAGING, TESTING) - I reviewed patient records, labs, notes, testing and imaging myself where available.   ASSESSMENT AND PLAN 52 y.o. year old female here with aching leg pain, suspicious for restless leg syndrome. Neuro exam normal. Couldn't tolerate higher lyrica dosing. Good response initially from 1 mg Neupro patch, then wore off, will increase to the 2 mg patch daily.  Meds ordered this encounter  Medications  . rotigotine (NEUPRO) 2 MG/24HR    Sig: Place 1 patch onto the skin daily.    Dispense:  30 patch    Refill:  12    Order Specific Question:  Supervising Provider    Answer:  Suanne Marker [[id number]]   Return in about 6 months  (around 06/24/2013).  Ronal Fear, MSN, NP-C 12/24/2012, 10:07 AM [facility] [address] [place], [place] [address] [phone]  Note: This document was prepared with digital dictation and possible smart phrase technology. Any transcriptional errors that result from this process are unintentional.

## 2012-12-24 NOTE — Patient Instructions (Addendum)
Increase Neupro patch to 2mg  patch daily.  You may wear 2 of the 1 mg patches until they are gone.  Follow up in 6 months with Dr. Marjory Lies or NP, sooner as needed.

## 2013-01-03 NOTE — Progress Notes (Signed)
I reviewed note and agree with plan.   Coltyn Hanning R. Avinash Maltos, MD  Certified in Neurology, Neurophysiology and Neuroimaging  [facility] [address], [address] [place], [place] [address] [phone]   

## 2013-04-01 ENCOUNTER — Telehealth: Payer: Self-pay | Admitting: Diagnostic Neuroimaging

## 2013-04-01 NOTE — Telephone Encounter (Signed)
Pt states she needs to be seen. She is experiencing swelling in her feet please call.

## 2013-04-01 NOTE — Telephone Encounter (Signed)
Patient said that she is having swelling in hands and feet since the weekend,could it be coming from the neuropatch (had a dosage change to 2 mg about 2 months ago), still having leg pain when walking.  Should she discontinue the patch?

## 2013-04-01 NOTE — Telephone Encounter (Signed)
Please setup follow up appt with me or Larita FifeLynn in next 1-2 weeks. Continue current medication. She should also see PCP re: swelling. -VRP

## 2013-04-02 NOTE — Telephone Encounter (Signed)
Called, scheduled and confirmed appointment with patient,shared message below from Dr Marjory LiesPenumalli

## 2013-04-08 ENCOUNTER — Encounter: Payer: Self-pay | Admitting: Diagnostic Neuroimaging

## 2013-04-08 ENCOUNTER — Encounter (INDEPENDENT_AMBULATORY_CARE_PROVIDER_SITE_OTHER): Payer: Self-pay

## 2013-04-08 ENCOUNTER — Ambulatory Visit (INDEPENDENT_AMBULATORY_CARE_PROVIDER_SITE_OTHER): Payer: BC Managed Care – PPO | Admitting: Diagnostic Neuroimaging

## 2013-04-08 VITALS — BP 128/82 | HR 85 | Ht 65.5 in | Wt 165.0 lb

## 2013-04-08 DIAGNOSIS — E611 Iron deficiency: Secondary | ICD-10-CM

## 2013-04-08 DIAGNOSIS — G2581 Restless legs syndrome: Secondary | ICD-10-CM

## 2013-04-08 DIAGNOSIS — D509 Iron deficiency anemia, unspecified: Secondary | ICD-10-CM

## 2013-04-08 MED ORDER — ROTIGOTINE 1 MG/24HR TD PT24: 1.0000 | MEDICATED_PATCH | Freq: Every day | TRANSDERMAL | Status: DC

## 2013-04-08 NOTE — Progress Notes (Signed)
PATIENT: [name] [name] DOB: [birth year]   REASON FOR VISIT: work in for restless legs and swelling in legs HISTORY FROM: patient  HISTORY OF PRESENT ILLNESS:  UPDATE 04/08/13 (VP): Since last visit, tried neupro 2mg  patch with some benefit in RLS. 2-3 weeks ago, stopped her iron supplement, because she was unsure about duration of therapy. Last Sunday, developed mild, gradual swelling in feet, ankles, fingers. No CP, SOB. Also with intermittent pain in legs with exertion/walking, and continue RLS symptoms at rest.   UPDATE 12/24/12 (LL):  Patient states Neupro 1 mg is working. Since the patch is working, she has been more active. She is very happy she is able to be more active. She has noticed her legs are hurting more at night with increased activity. Requesting to know if Neupro patch dosage should be increased.   PRIOR HPI 10/24/12 (VP):  53 year old right-handed female here for evaluation of leg pain. In patient reports pain in her legs, throughout, aching and sore feeling, which is worse when she is walking. Spent or she sits down. However later in the evening she feels restless and has the urge to move her legs. In the morning when she wakes up her legs are quite stiff. Symptoms are going on for past 6-8 months. Patient tried Lyrica 50 mg at bedtime, tried 100 mg dose but could not tolerate it. Patient was diagnosed with low iron levels and is on replacement for past one month.    REVIEW OF SYSTEMS: Full 14 system review of systems performed and notable only for increased weight leg swelling daytime sleepiness restless leg joint pain frequent urination.   ALLERGIES: Allergies  Allergen Reactions  . Penicillins Other (See Comments)    Childhood reaction    HOME MEDICATIONS: Outpatient Prescriptions Prior to Visit  Medication Sig Dispense Refill  . b complex vitamins tablet Take 1 tablet by mouth daily.      . calcium-vitamin D (OSCAL WITH D) 500-200 MG-UNIT per tablet Take 1  tablet by mouth daily.      . Garlic 1000 MG CAPS Take 1 capsule by mouth daily.      Marland Kitchen. HYDROcodone-ibuprofen (VICOPROFEN) 7.5-200 MG per tablet Take 1 tablet by mouth every 8 (eight) hours as needed for pain.  30 tablet  0  . ibuprofen (ADVIL,MOTRIN) 200 MG tablet Take 400-600 mg by mouth every 8 (eight) hours as needed. For headache      . LORazepam (ATIVAN) 0.5 MG tablet Take 0.5 mg by mouth every 8 (eight) hours as needed. Pt takes half tablet for anxiety.      . pantoprazole (PROTONIX) 40 MG tablet Take 40 mg by mouth 2 (two) times daily.      . ST JOHNS WORT PO Take 1 capsule by mouth daily.      . vitamin C (ASCORBIC ACID) 500 MG tablet Take 500 mg by mouth daily.      . rotigotine (NEUPRO) 2 MG/24HR Place 1 patch onto the skin daily.  30 patch  12   No facility-administered medications prior to visit.    PAST MEDICAL HISTORY: Past Medical History  Diagnosis Date  . GERD (gastroesophageal reflux disease)   . Headache(784.0)   . Anxiety     PAST SURGICAL HISTORY: Past Surgical History  Procedure Laterality Date  . Abdominal hysterectomy    . Colonoscopy    . Pubovaginal sling  12/12/2011    Procedure: Leonides GrillsPUBO-VAGINAL SLING;  Surgeon: Meriel Picaichard M Holland, MD;  Location: [facility];  Service: Gynecology;  Laterality: N/A;  Solyx Single Incision Sling  . Cystoscopy  12/12/2011    Procedure: CYSTOSCOPY;  Surgeon: Meriel Pica, MD;  Location: [facility];  Service: Gynecology;  Laterality: N/A;    FAMILY HISTORY: Family History  Problem Relation Age of Onset  . Heart attack Brother     SOCIAL HISTORY: History   Social History  . Marital Status: Married    Spouse Name: Jena Gauss    Number of Children: 2  . Years of Education: College   Occupational History  . CNA    Social History Main Topics  . Smoking status: Never Smoker   . Smokeless tobacco: Never Used  . Alcohol Use: Yes     Comment: occasionally  . Drug Use: No  . Sexual Activity: Not on file   Other Topics Concern    . Not on file   Social History Narrative   Patient lives at home with family.   Caffeine Use: 5 cups of tea daily     PHYSICAL EXAM  Filed Vitals:   04/08/13 0932  BP: 128/82  Pulse: 85  Height: 5' 5.5" (1.664 m)  Weight: 165 lb (74.844 kg)   Body mass index is 27.03 kg/(m^2).  GENERAL EXAM: Patient is in no distress; well developed, nourished and groomed; neck is supple  CARDIOVASCULAR: Regular rate and rhythm, no murmurs, no carotid bruits  NEUROLOGIC: MENTAL STATUS: awake, alert, oriented to person, place and time, recent and remote memory intact, normal attention and concentration, language fluent, comprehension intact, naming intact, fund of knowledge appropriate CRANIAL NERVE: no papilledema on fundoscopic exam, pupils equal and reactive to light, visual fields full to confrontation, extraocular muscles intact, no nystagmus, facial sensation and strength symmetric, hearing intact, palate elevates symmetrically, uvula midline, shoulder shrug symmetric, tongue midline. MOTOR: normal bulk and tone, full strength in the BUE, BLE SENSORY: normal and symmetric to light touch, temperature, vibration COORDINATION: finger-nose-finger, fine finger movements normal REFLEXES: deep tendon reflexes present and symmetric GAIT/STATION: narrow based gait; able to walk tandem; romberg is negative    DIAGNOSTIC DATA (LABS, IMAGING, TESTING) - I reviewed patient records, labs, notes, testing and imaging myself where available.   ASSESSMENT AND PLAN 53 y.o. female here with restless leg syndrome and low iron levels. Neuro exam normal. Couldn't tolerate higher lyrica dosing. Good response initially from 1 mg Neupro patch, then wore off, then having swelling on 2 mg patch. Could be medication side effect.  PLAN: - reduce to neupro 1mg  patch x 1 month, then re-evaluate symptoms - f/u with PCP re: low iron (and appropriate replacement) and alternate causes of leg swelling (cardiac, renal,  protein levels, venous insufficiency etc)   Meds ordered this encounter  Medications  . Rotigotine (NEUPRO) 1 MG/24HR PT24    Sig: Place 1 patch (1 mg total) onto the skin daily.    Dispense:  30 patch    Refill:  6   Return in about 3 months (around 07/09/2013).   Suanne Marker, MD 04/08/2013, 10:18 AM Certified in Neurology, Neurophysiology and Neuroimaging  [facility] [address] [place], [place] [address] [phone]

## 2013-04-08 NOTE — Addendum Note (Signed)
Addended byJoycelyn Schmid: Tate Jerkins on: 04/08/2013 10:53 AM   Modules accepted: Orders

## 2013-04-08 NOTE — Patient Instructions (Signed)
Reduce neupro patch to 1mg  daily for next 1 month, and see if leg swelling improves.  Follow up with primary doctor about other causes of leg swelling, and also about you iron levels.

## 2013-05-10 ENCOUNTER — Encounter: Payer: Self-pay | Admitting: Diagnostic Neuroimaging

## 2013-05-10 ENCOUNTER — Ambulatory Visit (INDEPENDENT_AMBULATORY_CARE_PROVIDER_SITE_OTHER): Payer: BC Managed Care – PPO | Admitting: Diagnostic Neuroimaging

## 2013-05-10 ENCOUNTER — Encounter (INDEPENDENT_AMBULATORY_CARE_PROVIDER_SITE_OTHER): Payer: Self-pay

## 2013-05-10 VITALS — BP 135/96 | HR 92 | Ht 65.5 in | Wt 168.0 lb

## 2013-05-10 DIAGNOSIS — D509 Iron deficiency anemia, unspecified: Secondary | ICD-10-CM

## 2013-05-10 DIAGNOSIS — E611 Iron deficiency: Secondary | ICD-10-CM

## 2013-05-10 DIAGNOSIS — G2581 Restless legs syndrome: Secondary | ICD-10-CM

## 2013-05-10 NOTE — Patient Instructions (Signed)
Continue neupro patch (1mg ).  Try to supplement iron levels slowly.

## 2013-05-10 NOTE — Progress Notes (Signed)
PATIENT: [name] [name] DOB: July 27, 1960   REASON FOR VISIT: follow up (RLS) HISTORY FROM: patient  HISTORY OF PRESENT ILLNESS:  UPDATE 05/10/13: His last visit patient reduced new pro patch to 1 mg daily. Since then swelling in legs has significantly improved. Patient was tested for iron deficiency and was found to be borderline low. She started iron 325 mg tablet, but had side effects of numbness and tingling and palpitations. She stopped iron tablet and the symptoms resolved. However the iron tablets did seem to improve her restless leg symptoms. Patient is planning to modify her diet to increase iron levels as well as try a low-dose iron supplement.  UPDATE 04/08/13 (VP): Since last visit, tried neupro 2mg  patch with some benefit in RLS. 2-3 weeks ago, stopped her iron supplement, because she was unsure about duration of therapy. Last Sunday, developed mild, gradual swelling in feet, ankles, fingers. No CP, SOB. Also with intermittent pain in legs with exertion/walking, and continue RLS symptoms at rest.   UPDATE 12/24/12 (LL):  Patient states Neupro 1 mg is working. Since the patch is working, she has been more active. She is very happy she is able to be more active. She has noticed her legs are hurting more at night with increased activity. Requesting to know if Neupro patch dosage should be increased.   PRIOR HPI 10/24/12 (VP):  53 year old right-handed female here for evaluation of leg pain. In patient reports pain in her legs, throughout, aching and sore feeling, which is worse when she is walking. Spent or she sits down. However later in the evening she feels restless and has the urge to move her legs. In the morning when she wakes up her legs are quite stiff. Symptoms are going on for past 6-8 months. Patient tried Lyrica 50 mg at bedtime, tried 100 mg dose but could not tolerate it. Patient was diagnosed with low iron levels and is on replacement for past one month.    REVIEW OF  SYSTEMS: Full 14 system review of systems performed and notable only for numbness fatigue palpitations restless legs.    ALLERGIES: Allergies  Allergen Reactions  . Penicillins Other (See Comments)    Childhood reaction    HOME MEDICATIONS: Outpatient Prescriptions Prior to Visit  Medication Sig Dispense Refill  . b complex vitamins tablet Take 1 tablet by mouth daily.      . calcium-vitamin D (OSCAL WITH D) 500-200 MG-UNIT per tablet Take 1 tablet by mouth daily.      . Garlic 1000 MG CAPS Take 1 capsule by mouth daily.      Marland Kitchen. HYDROcodone-ibuprofen (VICOPROFEN) 7.5-200 MG per tablet Take 1 tablet by mouth every 8 (eight) hours as needed for pain.  30 tablet  0  . ibuprofen (ADVIL,MOTRIN) 200 MG tablet Take 400-600 mg by mouth every 8 (eight) hours as needed. For headache      . LORazepam (ATIVAN) 0.5 MG tablet Take 0.5 mg by mouth every 8 (eight) hours as needed. Pt takes half tablet for anxiety.      . pantoprazole (PROTONIX) 40 MG tablet Take 40 mg by mouth 2 (two) times daily.      . Rotigotine (NEUPRO) 1 MG/24HR PT24 Place 1 patch (1 mg total) onto the skin daily.  30 patch  6  . ST JOHNS WORT PO Take 1 capsule by mouth daily.      . vitamin C (ASCORBIC ACID) 500 MG tablet Take 500 mg by mouth daily.  No facility-administered medications prior to visit.    PAST MEDICAL HISTORY: Past Medical History  Diagnosis Date  . GERD (gastroesophageal reflux disease)   . Headache(784.0)   . Anxiety     PAST SURGICAL HISTORY: Past Surgical History  Procedure Laterality Date  . Abdominal hysterectomy    . Colonoscopy    . Pubovaginal sling  12/12/2011    Procedure: Leonides GrillsPUBO-VAGINAL SLING;  Surgeon: Meriel Picaichard M Holland, MD;  Location: [facility];  Service: Gynecology;  Laterality: N/A;  Solyx Single Incision Sling  . Cystoscopy  12/12/2011    Procedure: CYSTOSCOPY;  Surgeon: Meriel Picaichard M Holland, MD;  Location: [facility];  Service: Gynecology;  Laterality: N/A;    FAMILY HISTORY: Family  History  Problem Relation Age of Onset  . Heart attack Brother   . Heart attack Father     SOCIAL HISTORY: History   Social History  . Marital Status: Married    Spouse Name: Jena GaussHugh    Number of Children: 2  . Years of Education: College   Occupational History  . CNA    Social History Main Topics  . Smoking status: Never Smoker   . Smokeless tobacco: Never Used  . Alcohol Use: Yes     Comment: occasionally  . Drug Use: No  . Sexual Activity: Not on file   Other Topics Concern  . Not on file   Social History Narrative   Patient lives at home with family.   Caffeine Use: 5 cups of tea daily     PHYSICAL EXAM  Filed Vitals:   05/10/13 1041  BP: 135/96  Pulse: 92  Height: 5' 5.5" (1.664 m)  Weight: 168 lb (76.204 kg)   Body mass index is 27.52 kg/(m^2).  GENERAL EXAM: Patient is in no distress; well developed, nourished and groomed; neck is supple  CARDIOVASCULAR: Regular rate and rhythm, no murmurs, no carotid bruits  NEUROLOGIC: MENTAL STATUS: awake, alert, oriented to person, place and time, recent and remote memory intact, normal attention and concentration, language fluent, comprehension intact, naming intact, fund of knowledge appropriate CRANIAL NERVE: no papilledema on fundoscopic exam, pupils equal and reactive to light, visual fields full to confrontation, extraocular muscles intact, no nystagmus, facial sensation and strength symmetric, hearing intact, palate elevates symmetrically, uvula midline, shoulder shrug symmetric, tongue midline. MOTOR: normal bulk and tone, full strength in the BUE, BLE SENSORY: normal and symmetric to light touch, temperature, vibration COORDINATION: finger-nose-finger, fine finger movements normal REFLEXES: deep tendon reflexes present and symmetric GAIT/STATION: narrow based gait; able to walk tandem; romberg is negative    DIAGNOSTIC DATA (LABS, IMAGING, TESTING) - I reviewed patient records, labs, notes, testing and  imaging myself where available.   ASSESSMENT AND PLAN 53 y.o. female here with restless leg syndrome and low iron levels. Neuro exam normal. Couldn't tolerate higher lyrica dosing. Good response initially from 1 mg Neupro patch, then wore off, then having swelling on 2 mg patch. Could be medication side effect. Now better on neupro 1mg  + low dose ron supplement.  PLAN: - continue neupro 1mg  patch - continue low iron replacement treatments  Return in about 2 months (around 07/10/2013).   Suanne MarkerVIKRAM R. Shamir Sedlar, MD 05/10/2013, 11:21 AM Certified in Neurology, Neurophysiology and Neuroimaging  [facility] [address], [address] [place], [place] [address] [phone]

## 2013-06-24 ENCOUNTER — Ambulatory Visit: Payer: BC Managed Care – PPO | Admitting: Diagnostic Neuroimaging

## 2013-07-10 ENCOUNTER — Encounter (INDEPENDENT_AMBULATORY_CARE_PROVIDER_SITE_OTHER): Payer: Self-pay

## 2013-07-10 ENCOUNTER — Encounter: Payer: Self-pay | Admitting: Diagnostic Neuroimaging

## 2013-07-10 ENCOUNTER — Ambulatory Visit (INDEPENDENT_AMBULATORY_CARE_PROVIDER_SITE_OTHER): Payer: BC Managed Care – PPO | Admitting: Diagnostic Neuroimaging

## 2013-07-10 VITALS — BP 136/93 | HR 93 | Temp 97.5°F | Ht 65.5 in | Wt 166.5 lb

## 2013-07-10 DIAGNOSIS — G2581 Restless legs syndrome: Secondary | ICD-10-CM

## 2013-07-10 MED ORDER — ROTIGOTINE 1 MG/24HR TD PT24: 1.0000 | MEDICATED_PATCH | Freq: Every day | TRANSDERMAL | Status: DC

## 2013-07-10 NOTE — Patient Instructions (Signed)
Continue neupro patch.

## 2013-07-10 NOTE — Progress Notes (Signed)
PATIENT: [name] RidgesJanet Rosenberry DOB: [birth year]   REASON FOR VISIT: follow up (RLS) HISTORY FROM: patient  HISTORY OF PRESENT ILLNESS:  UPDATE 07/10/13: Since last visit, doing well. Satisfied with RLS control (neupro patch, iron replacement). Separately notes more callous/bunion in bilateral feet.  UPDATE 05/10/13: His last visit patient reduced neupro patch to 1 mg daily. Since then swelling in legs has significantly improved. Patient was tested for iron deficiency and was found to be borderline low. She started iron 325 mg tablet, but had side effects of numbness and tingling and palpitations. She stopped iron tablet and the symptoms resolved. However the iron tablets did seem to improve her restless leg symptoms. Patient is planning to modify her diet to increase iron levels as well as try a low-dose iron supplement.  UPDATE 04/08/13 (VP): Since last visit, tried neupro 2mg  patch with some benefit in RLS. 2-3 weeks ago, stopped her iron supplement, because she was unsure about duration of therapy. Last Sunday, developed mild, gradual swelling in feet, ankles, fingers. No CP, SOB. Also with intermittent pain in legs with exertion/walking, and continue RLS symptoms at rest.   UPDATE 12/24/12 (LL):  Patient states Neupro 1 mg is working. Since the patch is working, she has been more active. She is very happy she is able to be more active. She has noticed her legs are hurting more at night with increased activity. Requesting to know if Neupro patch dosage should be increased.   PRIOR HPI 10/24/12 (VP):  53 year old right-handed female here for evaluation of leg pain. In patient reports pain in her legs, throughout, aching and sore feeling, which is worse when she is walking. Spent or she sits down. However later in the evening she feels restless and has the urge to move her legs. In the morning when she wakes up her legs are quite stiff. Symptoms are going on for past 6-8 months. Patient tried Lyrica 50 mg  at bedtime, tried 100 mg dose but could not tolerate it. Patient was diagnosed with low iron levels and is on replacement for past one month.    REVIEW OF SYSTEMS: Full 14 system review of systems performed and notable only for RLS.    ALLERGIES: Allergies  Allergen Reactions  . Penicillins Other (See Comments)    Childhood reaction    HOME MEDICATIONS: Outpatient Prescriptions Prior to Visit  Medication Sig Dispense Refill  . b complex vitamins tablet Take 1 tablet by mouth daily.      . calcium-vitamin D (OSCAL WITH D) 500-200 MG-UNIT per tablet Take 1 tablet by mouth daily.      . Garlic 1000 MG CAPS Take 1 capsule by mouth daily.      Marland Kitchen. HYDROcodone-ibuprofen (VICOPROFEN) 7.5-200 MG per tablet Take 1 tablet by mouth every 8 (eight) hours as needed for pain.  30 tablet  0  . ibuprofen (ADVIL,MOTRIN) 200 MG tablet Take 400-600 mg by mouth every 8 (eight) hours as needed. For headache      . LORazepam (ATIVAN) 0.5 MG tablet Take 0.5 mg by mouth every 8 (eight) hours as needed. Pt takes half tablet for anxiety.      . pantoprazole (PROTONIX) 40 MG tablet Take 40 mg by mouth 2 (two) times daily.      . ST JOHNS WORT PO Take 1 capsule by mouth daily.      . vitamin C (ASCORBIC ACID) 500 MG tablet Take 500 mg by mouth daily.      . Rotigotine (  NEUPRO) 1 MG/24HR PT24 Place 1 patch (1 mg total) onto the skin daily.  30 patch  6   No facility-administered medications prior to visit.    PAST MEDICAL HISTORY: Past Medical History  Diagnosis Date  . GERD (gastroesophageal reflux disease)   . Headache(784.0)   . Anxiety     PAST SURGICAL HISTORY: Past Surgical History  Procedure Laterality Date  . Abdominal hysterectomy    . Colonoscopy    . Pubovaginal sling  12/12/2011    Procedure: Leonides GrillsPUBO-VAGINAL SLING;  Surgeon: Meriel Picaichard M Holland, MD;  Location: [facility];  Service: Gynecology;  Laterality: N/A;  Solyx Single Incision Sling  . Cystoscopy  12/12/2011    Procedure: CYSTOSCOPY;   Surgeon: Meriel Picaichard M Holland, MD;  Location: [facility];  Service: Gynecology;  Laterality: N/A;    FAMILY HISTORY: Family History  Problem Relation Age of Onset  . Heart attack Brother   . Heart attack Father   . Heart Problems Father     SOCIAL HISTORY: History   Social History  . Marital Status: Married    Spouse Name: Jena GaussHugh    Number of Children: 2  . Years of Education: College   Occupational History  . CNA    Social History Main Topics  . Smoking status: Never Smoker   . Smokeless tobacco: Never Used  . Alcohol Use: Yes     Comment: occasionally  . Drug Use: No  . Sexual Activity: Not on file   Other Topics Concern  . Not on file   Social History Narrative   Patient lives at home with family.   Caffeine Use: 5 cups of tea daily     PHYSICAL EXAM  Filed Vitals:   07/10/13 1458  BP: 136/93  Pulse: 93  Temp: 97.5 F (36.4 C)  TempSrc: Oral  Height: 5' 5.5" (1.664 m)  Weight: 166 lb 8 oz (75.524 kg)   Body mass index is 27.28 kg/(m^2).  GENERAL EXAM: Patient is in no distress; well developed, nourished and groomed; neck is supple; BILATERAL BUNION AND CALLUS FORMATION IN FEET.  CARDIOVASCULAR: Regular rate and rhythm, no murmurs, no carotid bruits  NEUROLOGIC: MENTAL STATUS: awake, alert, language fluent, comprehension intact, naming intact, fund of knowledge appropriate CRANIAL NERVE: no papilledema on fundoscopic exam, pupils equal and reactive to light, visual fields full to confrontation, extraocular muscles intact, no nystagmus, facial sensation and strength symmetric, hearing intact, palate elevates symmetrically, uvula midline, shoulder shrug symmetric, tongue midline. MOTOR: normal bulk and tone, full strength in the BUE, BLE SENSORY: normal and symmetric to light touch, temperature, vibration COORDINATION: finger-nose-finger, fine finger movements normal REFLEXES: deep tendon reflexes present and symmetric GAIT/STATION: narrow based gait; able to  walk tandem; romberg is negative    DIAGNOSTIC DATA (LABS, IMAGING, TESTING) - I reviewed patient records, labs, notes, testing and imaging myself where available.   ASSESSMENT AND PLAN 53 y.o. female here with restless leg syndrome and low iron levels. Neuro exam normal. Doing well on neupro 1mg  patch and iron replacement.   PLAN: - continue neupro 1mg  patch - continue low iron replacement treatments  Return in about 6 months (around 01/09/2014).   Suanne MarkerVIKRAM R. Zaiyah Sottile, MD 07/10/2013, 3:35 PM Certified in Neurology, Neurophysiology and Neuroimaging  [facility] [address], [address] [place], [place] [address] [phone]

## 2013-12-17 ENCOUNTER — Other Ambulatory Visit: Payer: Self-pay | Admitting: Obstetrics and Gynecology

## 2013-12-17 ENCOUNTER — Ambulatory Visit
Admission: RE | Admit: 2013-12-17 | Discharge: 2013-12-17 | Disposition: A | Discharge status: Home / Self Care | Payer: BC Managed Care – PPO | Source: Ambulatory Visit | Attending: Obstetrics and Gynecology | Admitting: Obstetrics and Gynecology

## 2013-12-17 DIAGNOSIS — N6001 Solitary cyst of right breast: Secondary | ICD-10-CM

## 2014-01-10 ENCOUNTER — Ambulatory Visit: Payer: BC Managed Care – PPO | Admitting: Diagnostic Neuroimaging

## 2014-01-14 ENCOUNTER — Encounter: Payer: Self-pay | Admitting: Diagnostic Neuroimaging

## 2014-01-14 ENCOUNTER — Ambulatory Visit (INDEPENDENT_AMBULATORY_CARE_PROVIDER_SITE_OTHER): Payer: BC Managed Care – PPO | Admitting: Diagnostic Neuroimaging

## 2014-01-14 VITALS — BP 141/87 | HR 102 | Ht 65.5 in | Wt 175.2 lb

## 2014-01-14 DIAGNOSIS — E611 Iron deficiency: Secondary | ICD-10-CM

## 2014-01-14 DIAGNOSIS — G2581 Restless legs syndrome: Secondary | ICD-10-CM

## 2014-01-14 DIAGNOSIS — D509 Iron deficiency anemia, unspecified: Secondary | ICD-10-CM

## 2014-01-14 NOTE — Patient Instructions (Signed)
Try gradually increasing exercise.

## 2014-01-14 NOTE — Progress Notes (Signed)
PATIENT: [name] [name] DOB: 05/09/1960   REASON FOR VISIT: follow up (RLS) HISTORY FROM: patient  HISTORY OF PRESENT ILLNESS:  UPDATE 01/14/14: Since last visit, stable with RLS. Recently tried walking in a 5k, but had to wait for 2 hours in the cold before hand, and then had sig pain during the walk, and she couldn't complete it.   UPDATE 07/10/13: Since last visit, doing well. Satisfied with RLS control (neupro patch, iron replacement). Separately notes more callous/bunion in bilateral feet.  UPDATE 05/10/13: Since last visit patient reduced neupro patch to 1 mg daily. Since then swelling in legs has significantly improved. Patient was tested for iron deficiency and was found to be borderline low. She started iron 325 mg tablet, but had side effects of numbness and tingling and palpitations. She stopped iron tablet and the symptoms resolved. However the iron tablets did seem to improve her restless leg symptoms. Patient is planning to modify her diet to increase iron levels as well as try a low-dose iron supplement.  UPDATE 04/08/13 (VP): Since last visit, tried neupro 2mg  patch with some benefit in RLS. 2-3 weeks ago, stopped her iron supplement, because she was unsure about duration of therapy. Last Sunday, developed mild, gradual swelling in feet, ankles, fingers. No CP, SOB. Also with intermittent pain in legs with exertion/walking, and continue RLS symptoms at rest.   UPDATE 12/24/12 (LL):  Patient states Neupro 1 mg is working. Since the patch is working, she has been more active. She is very happy she is able to be more active. She has noticed her legs are hurting more at night with increased activity. Requesting to know if Neupro patch dosage should be increased.   PRIOR HPI 10/24/12 (VP):  53 year old right-handed female here for evaluation of leg pain. In patient reports pain in her legs, throughout, aching and sore feeling, which is worse when she is walking. Spent or she sits down.  However later in the evening she feels restless and has the urge to move her legs. In the morning when she wakes up her legs are quite stiff. Symptoms are going on for past 6-8 months. Patient tried Lyrica 50 mg at bedtime, tried 100 mg dose but could not tolerate it. Patient was diagnosed with low iron levels and is on replacement for past one month.    REVIEW OF SYSTEMS: Full 14 system review of systems performed and notable only for RLS.    ALLERGIES: Allergies  Allergen Reactions  . Lipitor [Atorvastatin]     Leg pain  . Lyrica [Pregabalin]     Leg pain  . Penicillins Other (See Comments)    Childhood reaction    HOME MEDICATIONS: Outpatient Prescriptions Prior to Visit  Medication Sig Dispense Refill  . b complex vitamins tablet Take 1 tablet by mouth daily.    . calcium-vitamin D (OSCAL WITH D) 500-200 MG-UNIT per tablet Take 1 tablet by mouth daily.    . Garlic 1000 MG CAPS Take 1 capsule by mouth daily.    Marland Kitchen. ibuprofen (ADVIL,MOTRIN) 200 MG tablet Take 400-600 mg by mouth every 8 (eight) hours as needed. For headache    . LORazepam (ATIVAN) 0.5 MG tablet Take 0.5 mg by mouth every 8 (eight) hours as needed. Pt takes half tablet for anxiety.    . pantoprazole (PROTONIX) 40 MG tablet Take 40 mg by mouth 2 (two) times daily.    . Rotigotine (NEUPRO) 1 MG/24HR PT24 Place 1 patch (1 mg total) onto the  skin daily. 30 patch 12  . ST JOHNS WORT PO Take 1 capsule by mouth daily.    . vitamin C (ASCORBIC ACID) 500 MG tablet Take 500 mg by mouth daily.    Marland Kitchen. HYDROcodone-ibuprofen (VICOPROFEN) 7.5-200 MG per tablet Take 1 tablet by mouth every 8 (eight) hours as needed for pain. 30 tablet 0   No facility-administered medications prior to visit.    PAST MEDICAL HISTORY: Past Medical History  Diagnosis Date  . GERD (gastroesophageal reflux disease)   . Headache(784.0)   . Anxiety     PAST SURGICAL HISTORY: Past Surgical History  Procedure Laterality Date  . Abdominal hysterectomy     . Colonoscopy    . Pubovaginal [name]  12/12/2011    Procedure: [name] GrillsPUBO-VAGINAL [name];  Surgeon: [name] Picaichard M Holland, MD;  Location: [facility];  Service: Gynecology;  Laterality: N/A;  Solyx Single Incision [name]  . Cystoscopy  12/12/2011    Procedure: CYSTOSCOPY;  Surgeon: [name] Picaichard M Holland, MD;  Location: [facility];  Service: Gynecology;  Laterality: N/A;    FAMILY HISTORY: Family History  Problem Relation Age of Onset  . Heart attack Brother   . Heart attack Father   . Heart Problems Father     SOCIAL HISTORY: History   Social History  . Marital Status: Married    Spouse Name: [name] GaussHugh    Number of Children: 2  . Years of Education: College   Occupational History  . CNA    Social History Main Topics  . Smoking status: Never Smoker   . Smokeless tobacco: Never Used  . Alcohol Use: Yes     Comment: occasionally  . Drug Use: No  . Sexual Activity: Not on file   Other Topics Concern  . Not on file   Social History Narrative   Patient lives at home with family.   Caffeine Use: 5 cups of tea daily     PHYSICAL EXAM  Filed Vitals:   01/14/14 1319  BP: 141/87  Pulse: 102  Height: 5' 5.5" (1.664 m)  Weight: 175 lb 3.2 oz (79.47 kg)   Body mass index is 28.7 kg/(m^2).  GENERAL EXAM: Patient is in no distress; well developed, nourished and groomed; neck is supple  CARDIOVASCULAR: Regular rate and rhythm, no murmurs, no carotid bruits  NEUROLOGIC: MENTAL STATUS: awake, alert, language fluent, comprehension intact, naming intact, fund of knowledge appropriate CRANIAL NERVE:  pupils equal and reactive to light, visual fields full to confrontation, extraocular muscles intact, no nystagmus, facial sensation and strength symmetric, hearing intact, palate elevates symmetrically, uvula midline, shoulder shrug symmetric, tongue midline. MOTOR: normal bulk and tone, full strength in the BUE, BLE SENSORY: normal and symmetric to light touch, temperature, vibration COORDINATION:  finger-nose-finger, fine finger movements normal REFLEXES: deep tendon reflexes present and symmetric GAIT/STATION: narrow based gait; able to walk tandem; romberg is negative    DIAGNOSTIC DATA (LABS, IMAGING, TESTING)  - None to review    ASSESSMENT AND PLAN 53 y.o. female here with restless leg syndrome and low iron levels. Neuro exam normal. Doing well on neupro 1mg  patch and iron replacement.   PLAN: - continue neupro 1mg  patch - continue low iron replacement treatments\ - gradually increase exercise; may need to consider EMG and MRI lumbar spine if she cannot improve her exercise tolerance  Return in about 4 months (around 05/16/2014).   Suanne MarkerVIKRAM R. Jadin Creque, MD 01/14/2014, 1:57 PM Certified in Neurology, Neurophysiology and Neuroimaging  [facility] [address], [address]  [place], [place] [address] [phone]

## 2014-04-08 ENCOUNTER — Other Ambulatory Visit: Payer: Self-pay | Admitting: Obstetrics and Gynecology

## 2014-04-09 LAB — CYTOLOGY - PAP

## 2014-04-15 ENCOUNTER — Telehealth: Payer: Self-pay | Admitting: *Deleted

## 2014-04-15 NOTE — Telephone Encounter (Signed)
Left a message on the pts phone asking her to call back and reschedule her follow-up appt. If she calls back, please try to get her scheduled for March 28, 291 or 30th as Dr. Marjory Liespenumalli has opened these days back. Her current appt on April 22nd has been cancelled.

## 2014-05-16 ENCOUNTER — Ambulatory Visit: Payer: BC Managed Care – PPO | Admitting: Diagnostic Neuroimaging

## 2014-05-26 ENCOUNTER — Ambulatory Visit (INDEPENDENT_AMBULATORY_CARE_PROVIDER_SITE_OTHER): Payer: BLUE CROSS/BLUE SHIELD | Admitting: Diagnostic Neuroimaging

## 2014-05-26 ENCOUNTER — Encounter: Payer: Self-pay | Admitting: Diagnostic Neuroimaging

## 2014-05-26 VITALS — BP 127/79 | HR 94 | Ht 65.5 in | Wt 157.0 lb

## 2014-05-26 DIAGNOSIS — G2581 Restless legs syndrome: Secondary | ICD-10-CM

## 2014-05-26 MED ORDER — ROTIGOTINE 1 MG/24HR TD PT24: 1.0000 | MEDICATED_PATCH | Freq: Every day | TRANSDERMAL | Status: DC

## 2014-05-26 NOTE — Progress Notes (Signed)
PATIENT: [name] [name] DOB: 1960/05/18   REASON FOR VISIT: follow up (RLS) HISTORY FROM: patient  HISTORY OF PRESENT ILLNESS:  UPDATE 05/26/14: Doing well. Has been working out at [employer]; doing much better with her exercise tolerance. She has gained strength and lost 25lbs. Overall RLS sxs better. Doing well on neupro patch.   UPDATE 01/14/14: Since last visit, stable with RLS. Recently tried walking in a 5k, but had to wait for 2 hours in the cold before hand, and then had sig pain during the walk, and she couldn't complete it.   UPDATE 07/10/13: Since last visit, doing well. Satisfied with RLS control (neupro patch, iron replacement). Separately notes more callous/bunion in bilateral feet.  UPDATE 05/10/13: Since last visit patient reduced neupro patch to 1 mg daily. Since then swelling in legs has significantly improved. Patient was tested for iron deficiency and was found to be borderline low. She started iron 325 mg tablet, but had side effects of numbness and tingling and palpitations. She stopped iron tablet and the symptoms resolved. However the iron tablets did seem to improve her restless leg symptoms. Patient is planning to modify her diet to increase iron levels as well as try a low-dose iron supplement.  UPDATE 04/08/13 (VP): Since last visit, tried neupro  patch with some benefit in RLS. 2-3 weeks ago, stopped her iron supplement, because she was unsure about duration of therapy. Last Sunday, developed mild, gradual swelling in feet, ankles, fingers. No CP, SOB. Also with intermittent pain in legs with exertion/walking, and continue RLS symptoms at rest.   UPDATE 12/24/12 (LL):  Patient states Neupro 1 mg is working. Since the patch is working, she has been more active. She is very happy she is able to be more active. She has noticed her legs are hurting more at night with increased activity. Requesting to know if Neupro patch dosage should be increased.   PRIOR HPI 10/24/12  (VP):  54 year old right-handed female here for evaluation of leg pain. In patient reports pain in her legs, throughout, aching and sore feeling, which is worse when she is walking. Spent or she sits down. However later in the evening she feels restless and has the urge to move her legs. In the morning when she wakes up her legs are quite stiff. Symptoms are going on for past 6-8 months. Patient tried Lyrica 50 mg at bedtime, tried 100 mg dose but could not tolerate it. Patient was diagnosed with low iron levels and is on replacement for past one month.    REVIEW OF SYSTEMS: Full 14 system review of systems performed and notable only for RLS.    ALLERGIES: Allergies  Allergen Reactions  . Lipitor [Atorvastatin]     Leg pain  . Lyrica [Pregabalin]     Leg pain  . Penicillins Other (See Comments)    Childhood reaction    HOME MEDICATIONS: Outpatient Prescriptions Prior to Visit  Medication Sig Dispense Refill  . b complex vitamins tablet Take 1 tablet by mouth daily.    . calcium-vitamin D (OSCAL WITH D) 500-200 MG-UNIT per tablet Take 1 tablet by mouth daily.    . CRESTOR 5 MG tablet Take 1 tablet by mouth daily.    . Garlic 1000 MG CAPS Take 1 capsule by mouth daily.    Marland Kitchen ibuprofen (ADVIL,MOTRIN) 200 MG tablet Take 400-600 mg by mouth every 8 (eight) hours as needed. For headache    . LORazepam (ATIVAN) 0.5 MG tablet Take 0.5  mg by mouth every 8 (eight) hours as needed. Pt takes half tablet for anxiety.    . pantoprazole (PROTONIX) 40 MG tablet Take 40 mg by mouth 2 (two) times daily.    . Rotigotine (NEUPRO) 1 MG/24HR PT24 Place 1 patch (1 mg total) onto the skin daily. 30 patch 12  . ST JOHNS WORT PO Take 1 capsule by mouth daily.    . vitamin C (ASCORBIC ACID) 500 MG tablet Take 500 mg by mouth daily.     No facility-administered medications prior to visit.    PAST MEDICAL HISTORY: Past Medical History  Diagnosis Date  . GERD (gastroesophageal reflux disease)   .  Headache(784.0)   . Anxiety     PAST SURGICAL HISTORY: Past Surgical History  Procedure Laterality Date  . Abdominal hysterectomy    . Colonoscopy    . Pubovaginal sling  12/12/2011    Procedure: Leonides GrillsPUBO-VAGINAL SLING;  Surgeon: Meriel Picaichard M Holland, MD;  Location: [facility];  Service: Gynecology;  Laterality: N/A;  Solyx Single Incision Sling  . Cystoscopy  12/12/2011    Procedure: CYSTOSCOPY;  Surgeon: Meriel Picaichard M Holland, MD;  Location: [facility];  Service: Gynecology;  Laterality: N/A;    FAMILY HISTORY: Family History  Problem Relation Age of Onset  . Heart attack Brother   . Heart attack Father   . Heart Problems Father     SOCIAL HISTORY: History   Social History  . Marital Status: Married    Spouse Name: Jena GaussHugh  . Number of Children: 2  . Years of Education: College   Occupational History  . CNA    Social History Main Topics  . Smoking status: Never Smoker   . Smokeless tobacco: Never Used  . Alcohol Use: Yes     Comment: occasionally  . Drug Use: No  . Sexual Activity: Not on file   Other Topics Concern  . Not on file   Social History Narrative   Patient lives at home with family.   Caffeine Use: 4 cups of tea daily     PHYSICAL EXAM  Filed Vitals:   05/26/14 1438  BP: 127/79  Pulse: 94  Height: 5' 5.5" (1.664 m)  Weight: 157 lb (71.215 kg)   Body mass index is 25.72 kg/(m^2).  GENERAL EXAM: Patient is in no distress; well developed, nourished and groomed; neck is supple  CARDIOVASCULAR: Regular rate and rhythm, no murmurs, no carotid bruits  NEUROLOGIC: MENTAL STATUS: awake, alert, language fluent, comprehension intact, naming intact, fund of knowledge appropriate CRANIAL NERVE:  pupils equal and reactive to light, visual fields full to confrontation, extraocular muscles intact, no nystagmus, facial sensation and strength symmetric, hearing intact, palate elevates symmetrically, uvula midline, shoulder shrug symmetric, tongue midline. MOTOR: normal  bulk and tone, full strength in the BUE, BLE SENSORY: normal and symmetric to light touch, temperature, vibration COORDINATION: finger-nose-finger, fine finger movements normal REFLEXES: deep tendon reflexes present and symmetric GAIT/STATION: narrow based gait; able to walk tandem; romberg is negative    DIAGNOSTIC DATA (LABS, IMAGING, TESTING)  - None to review    ASSESSMENT AND PLAN 54 y.o. female here with restless leg syndrome and low iron levels. Neuro exam normal. Doing well on neupro 1mg  patch and iron replacement.  PLAN: - continue neupro 1mg  patch - continue low iron replacement treatments  Return in about 1 year (around 05/26/2015).   Suanne MarkerVIKRAM R. Dessie Tatem, MD 05/26/2014, 3:04 PM Certified in Neurology, Neurophysiology and Neuroimaging  [facility] [address],  [place], [place] [address] [phone]

## 2014-05-26 NOTE — Patient Instructions (Signed)
Continue current medications. 

## 2015-01-05 ENCOUNTER — Telehealth: Payer: Self-pay | Admitting: Diagnostic Neuroimaging

## 2015-01-05 NOTE — Telephone Encounter (Signed)
Pt called sts she needs authorization for a new saving card for Rotigotine (NEUPRO) 1 MG/24HR PT24 . She pays $10/mth instead of $45 with this card.

## 2015-01-06 NOTE — Telephone Encounter (Signed)
Called pt. Informed Dr Rubie MaidPenumalli/Mary Clare not in office today. Will speak to Dr Marjory LiesPenumalli tomorrow regarding savings card and call her back tomorrow. She verbalized understanding.

## 2015-01-07 NOTE — Telephone Encounter (Signed)
I apologize for the delay, I was out of the office.  Patient may obtain savings card online at [email or web address].  I called back and spoke with the patient.  Relayed this info.  She is agreeable to obtaining discount voucher online, and will call us back if anything further is needed.

## 2015-05-26 ENCOUNTER — Ambulatory Visit (INDEPENDENT_AMBULATORY_CARE_PROVIDER_SITE_OTHER): Payer: BLUE CROSS/BLUE SHIELD | Admitting: Diagnostic Neuroimaging

## 2015-05-26 ENCOUNTER — Encounter: Payer: Self-pay | Admitting: Diagnostic Neuroimaging

## 2015-05-26 VITALS — BP 146/89 | HR 78 | Ht 65.5 in | Wt 165.5 lb

## 2015-05-26 DIAGNOSIS — D509 Iron deficiency anemia, unspecified: Secondary | ICD-10-CM

## 2015-05-26 DIAGNOSIS — E611 Iron deficiency: Secondary | ICD-10-CM

## 2015-05-26 DIAGNOSIS — G2581 Restless legs syndrome: Secondary | ICD-10-CM

## 2015-05-26 MED ORDER — ROTIGOTINE 1 MG/24HR TD PT24: 1.0000 | MEDICATED_PATCH | Freq: Every day | TRANSDERMAL | Status: DC

## 2015-05-26 NOTE — Patient Instructions (Signed)
-   continue neupro patch

## 2015-05-26 NOTE — Progress Notes (Signed)
PATIENT: [name] [name] DOB: [birth year]   REASON FOR VISIT: follow up (RLS) HISTORY FROM: patient  HISTORY OF PRESENT ILLNESS:  UPDATE 05/26/15: Since last visit, doing well. Tolerating ropinirole. Taking iron tabs.   UPDATE 05/26/14: Doing well. Has been working out at [employer]; doing much better with her exercise tolerance. She has gained strength and lost 25lbs. Overall RLS sxs better. Doing well on neupro patch.   UPDATE 01/14/14: Since last visit, stable with RLS. Recently tried walking in a 5k, but had to wait for 2 hours in the cold before hand, and then had sig pain during the walk, and she couldn't complete it.   UPDATE 07/10/13: Since last visit, doing well. Satisfied with RLS control (neupro patch, iron replacement). Separately notes more callous/bunion in bilateral feet.  UPDATE 05/10/13: Since last visit patient reduced neupro patch to 1 mg daily. Since then swelling in legs has significantly improved. Patient was tested for iron deficiency and was found to be borderline low. She started iron 325 mg tablet, but had side effects of numbness and tingling and palpitations. She stopped iron tablet and the symptoms resolved. However the iron tablets did seem to improve her restless leg symptoms. Patient is planning to modify her diet to increase iron levels as well as try a low-dose iron supplement.  UPDATE 04/08/13 (VP): Since last visit, tried neupro 2mg  patch with some benefit in RLS. 2-3 weeks ago, stopped her iron supplement, because she was unsure about duration of therapy. Last Sunday, developed mild, gradual swelling in feet, ankles, fingers. No CP, SOB. Also with intermittent pain in legs with exertion/walking, and continue RLS symptoms at rest.   UPDATE 12/24/12 (LL):  Patient states Neupro 1 mg is working. Since the patch is working, she has been more active. She is very happy she is able to be more active. She has noticed her legs are hurting more at night with increased  activity. Requesting to know if Neupro patch dosage should be increased.   PRIOR HPI 10/24/12 (VP):  55 year old right-handed female here for evaluation of leg pain. In patient reports pain in her legs, throughout, aching and sore feeling, which is worse when she is walking. Spent or she sits down. However later in the evening she feels restless and has the urge to move her legs. In the morning when she wakes up her legs are quite stiff. Symptoms are going on for past 6-8 months. Patient tried Lyrica 50 mg at bedtime, tried 100 mg dose but could not tolerate it. Patient was diagnosed with low iron levels and is on replacement for past one month.    REVIEW OF SYSTEMS: Full 14 system review of systems performed and negative except for back pain and aching muscles.    ALLERGIES: Allergies  Allergen Reactions  . Lipitor [Atorvastatin]     Leg pain  . Lyrica [Pregabalin]     Leg pain  . Penicillins Other (See Comments)    Childhood reaction    HOME MEDICATIONS: Outpatient Prescriptions Prior to Visit  Medication Sig Dispense Refill  . b complex vitamins tablet Take 1 tablet by mouth daily.    . calcium-vitamin D (OSCAL WITH D) 500-200 MG-UNIT per tablet Take 1 tablet by mouth daily.    . CRESTOR 5 MG tablet Take 1 tablet by mouth daily.    . Garlic 1000 MG CAPS Take 1 capsule by mouth daily.    Marland Kitchen ibuprofen (ADVIL,MOTRIN) 200 MG tablet Take 400-600 mg by mouth every  8 (eight) hours as needed. For headache    . LORazepam (ATIVAN) 0.5 MG tablet Take 0.5 mg by mouth every 8 (eight) hours as needed. Pt takes half tablet for anxiety.    . pantoprazole (PROTONIX) 40 MG tablet Take 40 mg by mouth 2 (two) times daily.    . ST JOHNS WORT PO Take 1 capsule by mouth daily.    . vitamin C (ASCORBIC ACID) 500 MG tablet Take 500 mg by mouth daily.    . Rotigotine (NEUPRO) 1 MG/24HR PT24 Place 1 patch (1 mg total) onto the skin daily. 30 patch 12   No facility-administered medications prior to visit.     PAST MEDICAL HISTORY: Past Medical History  Diagnosis Date  . GERD (gastroesophageal reflux disease)   . Headache(784.0)   . Anxiety     PAST SURGICAL HISTORY: Past Surgical History  Procedure Laterality Date  . Abdominal hysterectomy    . Colonoscopy    . Pubovaginal sling  12/12/2011    Procedure: Leonides GrillsPUBO-VAGINAL SLING;  Surgeon: Meriel Picaichard M Holland, MD;  Location: [facility];  Service: Gynecology;  Laterality: N/A;  Solyx Single Incision Sling  . Cystoscopy  12/12/2011    Procedure: CYSTOSCOPY;  Surgeon: Meriel Picaichard M Holland, MD;  Location: [facility];  Service: Gynecology;  Laterality: N/A;    FAMILY HISTORY: Family History  Problem Relation Age of Onset  . Heart attack Brother   . Heart attack Father   . Heart Problems Father     SOCIAL HISTORY: Social History   Social History  . Marital Status: Married    Spouse Name: Jena GaussHugh  . Number of Children: 2  . 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 Smoking status: Never Smoker   . Smokeless tobacco: Never Used  . Alcohol Use: Yes     Comment: occasionally  . Drug Use: No  . Sexual Activity: Not on file   Other Topics Concern  . Not on file   Social History Narrative   Patient lives at home with family.   Caffeine Use: 4 cups of tea daily     PHYSICAL EXAM  Filed Vitals:   05/26/15 0825  BP: 146/89  Pulse: 78  Height: 5' 5.5" (1.664 m)  Weight: 165 lb 8 oz (75.07 kg)   Body mass index is 27.11 kg/(m^2).  GENERAL EXAM: Patient is in no distress; well developed, nourished and groomed; neck is supple  CARDIOVASCULAR: Regular rate and rhythm, no murmurs, no carotid bruits  NEUROLOGIC: MENTAL STATUS: awake, alert, language fluent, comprehension intact, naming intact, fund of knowledge appropriate CRANIAL NERVE:  pupils equal and reactive to light, visual fields full to confrontation, extraocular muscles intact, no nystagmus, facial  sensation and strength symmetric, hearing intact, palate elevates symmetrically, uvula midline, shoulder shrug symmetric, tongue midline. MOTOR: normal bulk and tone, full strength in the BUE, BLE SENSORY: normal and symmetric to light touch, temperature, vibration COORDINATION: finger-nose-finger, fine finger movements normal REFLEXES: deep tendon reflexes present and symmetric GAIT/STATION: narrow based gait    DIAGNOSTIC DATA (LABS, IMAGING, TESTING)  - None to review    ASSESSMENT AND PLAN 55 y.o. female here with restless leg syndrome and low iron levels. Neuro exam normal. Doing well on neupro 1mg  patch and iron replacement.  Dx:  Restless leg syndrome  Low iron    PLAN: - continue neupro 1mg  patch - continue low iron replacement  treatments  Meds ordered this encounter  Medications  . Rotigotine (NEUPRO) 1 MG/24HR PT24    Sig: Place 1 patch (1 mg total) onto the skin daily.    Dispense:  30 patch    Refill:  12   Return in about 1 year (around 05/25/2016).   Suanne Marker, MD 05/26/2015, 8:57 AM Certified in Neurology, Neurophysiology and Neuroimaging  [facility] [address], [address] [place], [place] [address] [phone]

## 2015-07-31 ENCOUNTER — Other Ambulatory Visit: Payer: Self-pay | Admitting: Gastroenterology

## 2015-08-06 DIAGNOSIS — R1032 Left lower quadrant pain: Secondary | ICD-10-CM | POA: Diagnosis not present

## 2015-08-17 DIAGNOSIS — R1032 Left lower quadrant pain: Secondary | ICD-10-CM | POA: Diagnosis not present

## 2015-08-17 DIAGNOSIS — R102 Pelvic and perineal pain: Secondary | ICD-10-CM | POA: Diagnosis not present

## 2015-09-07 NOTE — H&P (Signed)
Clent RidgesJanet Badie  DICTATION # [id number] CSN# [id number]   Meriel PicaHOLLAND,Jataya Wann M, MD 09/07/2015 9:48 AM

## 2015-09-08 NOTE — H&P (Signed)
NAMClent Ridges:  [name], [name]             ACCOUNT NO.:  [id number]  MEDICAL RECORD NO.:  [id number]  LOCATION:                                 FACILITY:  PHYSICIAN:  [name] [name], M.D.DATE OF BIRTH:  1960-03-24  DATE OF ADMISSION:  09/21/2015 DATE OF DISCHARGE:                             HISTORY & PHYSICAL   CHIEF COMPLAINT:  Chronic left lower quadrant pain, rule out adnexal adhesions.  HISTORY OF PRESENT ILLNESS:  A 55 year old, G3, P2.  She has had a prior LAVH in 2007.  Ovarian findings at the time were normal.  She did have a mid urethral sling in 2013 that has worked well for her.  Over the past number of months, she has experienced worsening left lower quadrant pain.  As part of her evaluation on 07/17, ultrasound showed no significant cysts, no free fluid.  Due to the severity of the pain, she presents at this time for laparoscopy with possible LSO, or laparotomy with LSO depending on findings.  Specific procedure including risks related to bleeding, infection, phlebitis, wound infection, expected recovery time, and the possible need to complete surgery by open technique reviewed with her also.  Note that she did have a colonoscopy this summer that was unremarkable.  MEDICATIONS:  Currently, Crestor, Protonix, multivitamins, extra supplements, vitamin C, calcium and vitamin D, lorazepam p.r.n.  ALLERGIES:  Listed as PENICILLIN.  PRIOR SURGERIES:  Two vaginal deliveries.  She has had a mid urethral sling and LAVH as noted.  FAMILY HISTORY:  Significant for history of diabetes and heart disease.  SOCIAL HISTORY:  Denies drug or tobacco use.  She does drink alcohol socially.  She is married.  Dr. Okey [name] is her PCP.  PHYSICAL EXAMINATION:  VITAL SIGNS:  Temp 98.2, blood pressure 110/80. HEENT:  Unremarkable. NECK:  Supple.  No masses. LUNGS:  Clear. CARDIOVASCULAR:  Regular rate and rhythm without murmurs, rubs, or gallops. BREASTS:  Without masses. ABDOMEN:  Soft,  flat, nontender. PELVIC:  Vulva, vagina unremarkable.  Vaginal cuff is normal.  On bimanual exam, slightly tender on the left, but no nodularity or masses noted. EXTREMITIES:  Unremarkable. NEUROLOGIC:  Unremarkable.  IMPRESSION:  Chronic left lower quadrant pelvic pain, rule out pelvic adhesive disease.  PLAN:  Laparoscopy with LSO.  Possible laparotomy with LSO.  Procedure and risks discussed as above.     [name] [name], M.D.   ______________________________ [name] [name], M.D.    RMH/MEDQ  D:  09/07/2015  T:  09/07/2015  Job:  [id number]

## 2015-09-11 ENCOUNTER — Encounter (HOSPITAL_BASED_OUTPATIENT_CLINIC_OR_DEPARTMENT_OTHER): Payer: Self-pay | Admitting: *Deleted

## 2015-09-14 ENCOUNTER — Encounter (HOSPITAL_BASED_OUTPATIENT_CLINIC_OR_DEPARTMENT_OTHER): Payer: Self-pay | Admitting: *Deleted

## 2015-09-14 NOTE — Progress Notes (Signed)
NPO AFTER MN.  ARRIVE AT 0600.  GETTING CBC DONE Friday 09-18-2015.  WILL TAKE PROTONIX AND CRESTOR AM DOS W/ SIPS OF WATER.

## 2015-09-17 DIAGNOSIS — N736 Female pelvic peritoneal adhesions (postinfective): Secondary | ICD-10-CM | POA: Diagnosis not present

## 2015-09-17 DIAGNOSIS — R1032 Left lower quadrant pain: Secondary | ICD-10-CM | POA: Diagnosis present

## 2015-09-17 DIAGNOSIS — Z79899 Other long term (current) drug therapy: Secondary | ICD-10-CM | POA: Diagnosis not present

## 2015-09-17 DIAGNOSIS — F419 Anxiety disorder, unspecified: Secondary | ICD-10-CM | POA: Diagnosis not present

## 2015-09-17 DIAGNOSIS — N8302 Follicular cyst of left ovary: Secondary | ICD-10-CM | POA: Diagnosis not present

## 2015-09-17 DIAGNOSIS — N838 Other noninflammatory disorders of ovary, fallopian tube and broad ligament: Secondary | ICD-10-CM | POA: Diagnosis not present

## 2015-09-17 DIAGNOSIS — K219 Gastro-esophageal reflux disease without esophagitis: Secondary | ICD-10-CM | POA: Diagnosis not present

## 2015-09-17 LAB — CBC
HCT: 39 % (ref 36.0–46.0)
Hemoglobin: 13 g/dL (ref 12.0–15.0)
MCH: 30.4 pg (ref 26.0–34.0)
MCHC: 33.3 g/dL (ref 30.0–36.0)
MCV: 91.3 fL (ref 78.0–100.0)
Platelets: 292 10*3/uL (ref 150–400)
RBC: 4.27 MIL/uL (ref 3.87–5.11)
RDW: 14 % (ref 11.5–15.5)
WBC: 9.8 10*3/uL (ref 4.0–10.5)

## 2015-09-20 NOTE — Anesthesia Preprocedure Evaluation (Signed)
Anesthesia Evaluation  Patient identified by MRN, date of birth, ID band Patient awake    Reviewed: Allergy & Precautions, NPO status , Patient's Chart, lab work & pertinent test results  Airway Mallampati: II  TM Distance: >3 FB Neck ROM: Full    Dental no notable dental hx.    Pulmonary neg pulmonary ROS,    Pulmonary exam normal breath sounds clear to auscultation       Cardiovascular negative cardio ROS Normal cardiovascular exam Rhythm:Regular Rate:Normal     Neuro/Psych  Headaches, Anxiety    GI/Hepatic Neg liver ROS, GERD  ,  Endo/Other  negative endocrine ROS  Renal/GU negative Renal ROS  negative genitourinary   Musculoskeletal negative musculoskeletal ROS (+)   Abdominal   Peds negative pediatric ROS (+)  Hematology negative hematology ROS (+)   Anesthesia Other Findings   Reproductive/Obstetrics negative OB ROS                             Anesthesia Physical Anesthesia Plan  ASA: II  Anesthesia Plan: General   Post-op Pain Management:    Induction: Intravenous  Airway Management Planned: Oral ETT  Additional Equipment:   Intra-op Plan:   Post-operative Plan: Extubation in OR  Informed Consent: I have reviewed the patients History and Physical, chart, labs and discussed the procedure including the risks, benefits and alternatives for the proposed anesthesia with the patient or authorized representative who has indicated his/her understanding and acceptance.   Dental advisory given  Plan Discussed with: CRNA  Anesthesia Plan Comments:         Anesthesia Quick Evaluation

## 2015-09-21 ENCOUNTER — Ambulatory Visit (HOSPITAL_BASED_OUTPATIENT_CLINIC_OR_DEPARTMENT_OTHER)
Admission: RE | Admit: 2015-09-21 | Discharge: 2015-09-21 | Disposition: A | Discharge status: Home / Self Care | Payer: BLUE CROSS/BLUE SHIELD | Source: Ambulatory Visit | Attending: Obstetrics and Gynecology | Admitting: Obstetrics and Gynecology

## 2015-09-21 ENCOUNTER — Encounter (HOSPITAL_BASED_OUTPATIENT_CLINIC_OR_DEPARTMENT_OTHER)
Admission: RE | Disposition: A | Discharge status: Home / Self Care | Payer: Self-pay | Source: Ambulatory Visit | Attending: Obstetrics and Gynecology

## 2015-09-21 ENCOUNTER — Encounter (HOSPITAL_BASED_OUTPATIENT_CLINIC_OR_DEPARTMENT_OTHER): Payer: Self-pay | Admitting: *Deleted

## 2015-09-21 ENCOUNTER — Ambulatory Visit (HOSPITAL_BASED_OUTPATIENT_CLINIC_OR_DEPARTMENT_OTHER): Payer: BLUE CROSS/BLUE SHIELD | Admitting: Anesthesiology

## 2015-09-21 DIAGNOSIS — N838 Other noninflammatory disorders of ovary, fallopian tube and broad ligament: Secondary | ICD-10-CM | POA: Insufficient documentation

## 2015-09-21 DIAGNOSIS — N736 Female pelvic peritoneal adhesions (postinfective): Secondary | ICD-10-CM | POA: Insufficient documentation

## 2015-09-21 DIAGNOSIS — Z79899 Other long term (current) drug therapy: Secondary | ICD-10-CM | POA: Insufficient documentation

## 2015-09-21 DIAGNOSIS — F419 Anxiety disorder, unspecified: Secondary | ICD-10-CM | POA: Insufficient documentation

## 2015-09-21 DIAGNOSIS — K219 Gastro-esophageal reflux disease without esophagitis: Secondary | ICD-10-CM | POA: Insufficient documentation

## 2015-09-21 DIAGNOSIS — N8302 Follicular cyst of left ovary: Secondary | ICD-10-CM | POA: Insufficient documentation

## 2015-09-21 HISTORY — DX: Elevated blood-pressure reading, without diagnosis of hypertension: R03.0

## 2015-09-21 HISTORY — DX: Personal history of colonic polyps: Z86.010

## 2015-09-21 HISTORY — DX: Restless legs syndrome: G25.81

## 2015-09-21 HISTORY — DX: Presence of spectacles and contact lenses: Z97.3

## 2015-09-21 HISTORY — DX: Hyperlipidemia, unspecified: E78.5

## 2015-09-21 HISTORY — DX: Left lower quadrant pain: R10.32

## 2015-09-21 HISTORY — DX: Personal history of adenomatous and serrated colon polyps: Z86.0101

## 2015-09-21 HISTORY — PX: LAPAROSCOPIC SALPINGO OOPHERECTOMY: SHX5927

## 2015-09-21 HISTORY — DX: Iron deficiency: E61.1

## 2015-09-21 LAB — TYPE AND SCREEN
ABO/RH(D): O POS
Antibody Screen: NEGATIVE

## 2015-09-21 LAB — ABO/RH: ABO/RH(D): O POS

## 2015-09-21 SURGERY — SALPINGO-OOPHORECTOMY, LAPAROSCOPIC
Anesthesia: General | Site: Abdomen | Laterality: Left

## 2015-09-21 MED ORDER — DEXAMETHASONE SODIUM PHOSPHATE 4 MG/ML IJ SOLN
INTRAMUSCULAR | Status: DC | PRN
  Administered 2015-09-21: 10 mg via INTRAVENOUS

## 2015-09-21 MED ORDER — MIDAZOLAM HCL 5 MG/5ML IJ SOLN
INTRAMUSCULAR | Status: DC | PRN
  Administered 2015-09-21: 2 mg via INTRAVENOUS

## 2015-09-21 MED ORDER — BUPIVACAINE HCL (PF) 0.25 % IJ SOLN
INTRAMUSCULAR | Status: DC | PRN
  Administered 2015-09-21: 6.5 mL

## 2015-09-21 MED ORDER — LIDOCAINE HCL (CARDIAC) 20 MG/ML IV SOLN
INTRAVENOUS | Status: DC | PRN
  Administered 2015-09-21: 60 mg via INTRAVENOUS

## 2015-09-21 MED ORDER — ACETAMINOPHEN 500 MG PO TABS
ORAL_TABLET | ORAL | Status: AC
  Filled 2015-09-21: qty 2

## 2015-09-21 MED ORDER — SUCCINYLCHOLINE CHLORIDE 20 MG/ML IJ SOLN
INTRAMUSCULAR | Status: DC | PRN
  Administered 2015-09-21: 100 mg via INTRAVENOUS

## 2015-09-21 MED ORDER — OXYCODONE HCL 5 MG PO TABS
ORAL_TABLET | ORAL | Status: AC
  Filled 2015-09-21: qty 1

## 2015-09-21 MED ORDER — OXYCODONE HCL 5 MG PO TABS
5.0000 mg | ORAL_TABLET | Freq: Once | ORAL | Status: AC
Start: 2015-09-21 — End: 2015-09-21
  Administered 2015-09-21: 5 mg via ORAL
  Filled 2015-09-21: qty 1

## 2015-09-21 MED ORDER — LIDOCAINE HCL 4 % EX SOLN
CUTANEOUS | Status: DC | PRN
  Administered 2015-09-21: 2 mL via TOPICAL

## 2015-09-21 MED ORDER — KETOROLAC TROMETHAMINE 30 MG/ML IJ SOLN
INTRAMUSCULAR | Status: AC
  Filled 2015-09-21: qty 1

## 2015-09-21 MED ORDER — LIDOCAINE HCL (CARDIAC) 20 MG/ML IV SOLN
INTRAVENOUS | Status: AC
  Filled 2015-09-21: qty 5

## 2015-09-21 MED ORDER — KETOROLAC TROMETHAMINE 30 MG/ML IJ SOLN
INTRAMUSCULAR | Status: DC | PRN
  Administered 2015-09-21: 30 mg via INTRAVENOUS

## 2015-09-21 MED ORDER — ARTIFICIAL TEARS OP OINT
TOPICAL_OINTMENT | OPHTHALMIC | Status: AC
  Filled 2015-09-21: qty 3.5

## 2015-09-21 MED ORDER — ACETAMINOPHEN 500 MG PO TABS
1000.0000 mg | ORAL_TABLET | Freq: Once | ORAL | Status: AC
  Administered 2015-09-21: 1000 mg via ORAL
  Filled 2015-09-21: qty 2

## 2015-09-21 MED ORDER — ONDANSETRON HCL 4 MG/2ML IJ SOLN
INTRAMUSCULAR | Status: AC
  Filled 2015-09-21: qty 2

## 2015-09-21 MED ORDER — METOCLOPRAMIDE HCL 5 MG/ML IJ SOLN
INTRAMUSCULAR | Status: DC | PRN
  Administered 2015-09-21 (×2): 5 mg via INTRAVENOUS

## 2015-09-21 MED ORDER — PROMETHAZINE HCL 25 MG/ML IJ SOLN
6.2500 mg | INTRAMUSCULAR | Status: DC | PRN
  Filled 2015-09-21: qty 1

## 2015-09-21 MED ORDER — FENTANYL CITRATE (PF) 100 MCG/2ML IJ SOLN
INTRAMUSCULAR | Status: DC | PRN
  Administered 2015-09-21 (×4): 50 ug via INTRAVENOUS

## 2015-09-21 MED ORDER — ROCURONIUM BROMIDE 100 MG/10ML IV SOLN
INTRAVENOUS | Status: AC
  Filled 2015-09-21: qty 1

## 2015-09-21 MED ORDER — FENTANYL CITRATE (PF) 100 MCG/2ML IJ SOLN
25.0000 ug | INTRAMUSCULAR | Status: DC | PRN
  Administered 2015-09-21 (×3): 25 ug via INTRAVENOUS
  Filled 2015-09-21: qty 1

## 2015-09-21 MED ORDER — ONDANSETRON HCL 4 MG/2ML IJ SOLN
INTRAMUSCULAR | Status: DC | PRN
  Administered 2015-09-21: 4 mg via INTRAVENOUS

## 2015-09-21 MED ORDER — LACTATED RINGERS IV SOLN
INTRAVENOUS | Status: DC
  Administered 2015-09-21 (×2): via INTRAVENOUS
  Filled 2015-09-21: qty 1000

## 2015-09-21 MED ORDER — SUGAMMADEX SODIUM 200 MG/2ML IV SOLN
INTRAVENOUS | Status: DC | PRN
  Administered 2015-09-21: 150 mg via INTRAVENOUS

## 2015-09-21 MED ORDER — ROCURONIUM BROMIDE 100 MG/10ML IV SOLN
INTRAVENOUS | Status: DC | PRN
  Administered 2015-09-21: 25 mg via INTRAVENOUS

## 2015-09-21 MED ORDER — DEXAMETHASONE SODIUM PHOSPHATE 10 MG/ML IJ SOLN
INTRAMUSCULAR | Status: AC
  Filled 2015-09-21: qty 1

## 2015-09-21 MED ORDER — PROPOFOL 10 MG/ML IV BOLUS
INTRAVENOUS | Status: DC | PRN
  Administered 2015-09-21: 180 mg via INTRAVENOUS

## 2015-09-21 MED ORDER — SUGAMMADEX SODIUM 200 MG/2ML IV SOLN
INTRAVENOUS | Status: AC
  Filled 2015-09-21: qty 2

## 2015-09-21 MED ORDER — FENTANYL CITRATE (PF) 100 MCG/2ML IJ SOLN
INTRAMUSCULAR | Status: AC
  Filled 2015-09-21: qty 2

## 2015-09-21 MED ORDER — METOCLOPRAMIDE HCL 5 MG/ML IJ SOLN
INTRAMUSCULAR | Status: AC
  Filled 2015-09-21: qty 2

## 2015-09-21 MED ORDER — OXYCODONE-ACETAMINOPHEN 5-325 MG PO TABS: 1.0000 | ORAL_TABLET | Freq: Four times a day (QID) | ORAL | 0 refills | Status: DC | PRN

## 2015-09-21 MED ORDER — MIDAZOLAM HCL 2 MG/2ML IJ SOLN
INTRAMUSCULAR | Status: AC
  Filled 2015-09-21: qty 2

## 2015-09-21 MED ORDER — PROPOFOL 10 MG/ML IV BOLUS
INTRAVENOUS | Status: AC
  Filled 2015-09-21: qty 40

## 2015-09-21 MED ORDER — FENTANYL CITRATE (PF) 250 MCG/5ML IJ SOLN
INTRAMUSCULAR | Status: AC
  Filled 2015-09-21: qty 5

## 2015-09-21 SURGICAL SUPPLY — 103 items
APL SKNCLS STERI-STRIP NONHPOA (GAUZE/BANDAGES/DRESSINGS) ×2
APPLICATOR COTTON TIP 6IN STRL (MISCELLANEOUS) ×3 IMPLANT
BAG RETRIEVAL 10 (BASKET) ×1
BAG SPEC RTRVL LRG 6X4 10 (ENDOMECHANICALS)
BAG URINE DRAINAGE (UROLOGICAL SUPPLIES) ×1 IMPLANT
BANDAGE ADHESIVE 1X3 (GAUZE/BANDAGES/DRESSINGS) IMPLANT
BENZOIN TINCTURE PRP APPL 2/3 (GAUZE/BANDAGES/DRESSINGS) ×2 IMPLANT
BLADE CLIPPER SURG (BLADE) IMPLANT
BLADE SURG 10 STRL SS (BLADE) ×6 IMPLANT
BLADE SURG 11 STRL SS (BLADE) ×3 IMPLANT
CANISTER SUCTION 2500CC (MISCELLANEOUS) ×3 IMPLANT
CATH FOLEY 2WAY SLVR  5CC 14FR (CATHETERS) ×1
CATH FOLEY 2WAY SLVR 5CC 14FR (CATHETERS) ×2 IMPLANT
CATH ROBINSON RED A/P 16FR (CATHETERS) IMPLANT
CLOTH BEACON ORANGE TIMEOUT ST (SAFETY) ×3 IMPLANT
COVER BACK TABLE 60X90IN (DRAPES) ×3 IMPLANT
COVER MAYO STAND STRL (DRAPES) ×3 IMPLANT
DECANTER SPIKE VIAL GLASS SM (MISCELLANEOUS) IMPLANT
DRAPE LAPAROTOMY T 102X78X121 (DRAPES) ×3 IMPLANT
DRAPE UNDERBUTTOCKS STRL (DRAPE) ×3 IMPLANT
DRSG COVADERM PLUS 2X2 (GAUZE/BANDAGES/DRESSINGS) ×2 IMPLANT
DRSG OPSITE POSTOP 3X4 (GAUZE/BANDAGES/DRESSINGS) IMPLANT
DRSG TELFA 3X8 NADH (GAUZE/BANDAGES/DRESSINGS) ×3 IMPLANT
ELECT REM PT RETURN 9FT ADLT (ELECTROSURGICAL) ×3
ELECTRODE REM PT RTRN 9FT ADLT (ELECTROSURGICAL) ×2 IMPLANT
FILTER SMOKE EVAC LAPAROSHD (FILTER) IMPLANT
GAUZE SPONGE 4X4 12PLY STRL LF (GAUZE/BANDAGES/DRESSINGS) ×6 IMPLANT
GLOVE BIO SURGEON STRL SZ7 (GLOVE) ×6 IMPLANT
GLOVE BIOGEL PI IND STRL 7.0 (GLOVE) ×1 IMPLANT
GLOVE BIOGEL PI IND STRL 7.5 (GLOVE) ×2 IMPLANT
GLOVE BIOGEL PI INDICATOR 7.0 (GLOVE) ×1
GLOVE BIOGEL PI INDICATOR 7.5 (GLOVE) ×2
GLOVE ECLIPSE 7.0 STRL STRAW (GLOVE) ×2 IMPLANT
GOWN STRL REUS W/ TWL LRG LVL3 (GOWN DISPOSABLE) ×5 IMPLANT
GOWN STRL REUS W/TWL LRG LVL3 (GOWN DISPOSABLE) ×8 IMPLANT
KIT ROOM TURNOVER WOR (KITS) ×3 IMPLANT
LEGGING LITHOTOMY PAIR STRL (DRAPES) ×3 IMPLANT
LIQUID BAND (GAUZE/BANDAGES/DRESSINGS) ×2 IMPLANT
MANIFOLD NEPTUNE II (INSTRUMENTS) IMPLANT
NDL HYPO 25X1 1.5 SAFETY (NEEDLE) ×1 IMPLANT
NDL INSUFFLATION 14GA 120MM (NEEDLE) ×1 IMPLANT
NDL INSUFFLATION 14GA 150MM (NEEDLE) IMPLANT
NEEDLE HYPO 25X1 1.5 SAFETY (NEEDLE) ×3 IMPLANT
NEEDLE INSUFFLATION 14GA 120MM (NEEDLE) ×3 IMPLANT
NEEDLE INSUFFLATION 14GA 150MM (NEEDLE) IMPLANT
NS IRRIG 1000ML POUR BTL (IV SOLUTION) ×3 IMPLANT
NS IRRIG 500ML POUR BTL (IV SOLUTION) ×3 IMPLANT
PACK BASIN DAY SURGERY FS (CUSTOM PROCEDURE TRAY) ×3 IMPLANT
PACK LAPAROSCOPY II (CUSTOM PROCEDURE TRAY) ×3 IMPLANT
PAD DRESSING TELFA 3X8 NADH (GAUZE/BANDAGES/DRESSINGS) ×1 IMPLANT
PAD ION LEFT ARM DISP (MISCELLANEOUS) ×2 IMPLANT
PAD OB MATERNITY 4.3X12.25 (PERSONAL CARE ITEMS) ×3 IMPLANT
PAD PREP 24X48 CUFFED NSTRL (MISCELLANEOUS) ×3 IMPLANT
PADDING ION DISPOSABLE (MISCELLANEOUS) ×3 IMPLANT
PENCIL BUTTON HOLSTER BLD 10FT (ELECTRODE) ×3 IMPLANT
POUCH SPECIMEN RETRIEVAL 10MM (ENDOMECHANICALS) IMPLANT
RINGERS IRRIG 1000ML POUR BTL (IV SOLUTION) ×3 IMPLANT
SCALPEL HARMONIC ACE (MISCELLANEOUS) IMPLANT
SCISSORS LAP 5X35 DISP (ENDOMECHANICALS) IMPLANT
SEALER TISSUE G2 CVD JAW 35 (ENDOMECHANICALS) IMPLANT
SEALER TISSUE G2 CVD JAW 45CM (ENDOMECHANICALS) ×2 IMPLANT
SET IRRIG TUBING LAPAROSCOPIC (IRRIGATION / IRRIGATOR) IMPLANT
SOLUTION ANTI FOG 6CC (MISCELLANEOUS) ×3 IMPLANT
SPONGE GAUZE 4X4 12PLY STER LF (GAUZE/BANDAGES/DRESSINGS) ×2 IMPLANT
SPONGE LAP 18X18 X RAY DECT (DISPOSABLE) ×3 IMPLANT
STAPLER VISISTAT 35W (STAPLE) ×3 IMPLANT
STRIP CLOSURE SKIN 1/2X4 (GAUZE/BANDAGES/DRESSINGS) ×2 IMPLANT
STRIP CLOSURE SKIN 1/4X4 (GAUZE/BANDAGES/DRESSINGS) ×3 IMPLANT
SUT CHROMIC 0 CT 1 (SUTURE) ×3 IMPLANT
SUT MNCRL AB 3-0 PS2 18 (SUTURE) IMPLANT
SUT MNCRL AB 4-0 PS2 18 (SUTURE) ×7 IMPLANT
SUT MON AB 2-0 CT1 36 (SUTURE) IMPLANT
SUT PDS AB 0 CT1 27 (SUTURE) ×6 IMPLANT
SUT PLAIN 4 0 FS 2 27 (SUTURE) IMPLANT
SUT VIC AB 0 CT1 18XCR BRD8 (SUTURE) ×2 IMPLANT
SUT VIC AB 0 CT1 8-18 (SUTURE) ×3
SUT VIC AB 2-0 UR6 27 (SUTURE) ×3 IMPLANT
SUT VIC AB 3-0 CT1 27 (SUTURE) ×3
SUT VIC AB 3-0 CT1 TAPERPNT 27 (SUTURE) ×2 IMPLANT
SUT VIC AB 3-0 SH 27 (SUTURE)
SUT VIC AB 3-0 SH 27X BRD (SUTURE) IMPLANT
SUT VICRYL 0 TIES 12 18 (SUTURE) ×3 IMPLANT
SUT VICRYL 0 UR6 27IN ABS (SUTURE) IMPLANT
SUT VICRYL RAPIDE 3 0 (SUTURE) IMPLANT
SUT VICRYL RAPIDE 4/0 PS 2 (SUTURE) IMPLANT
SYR 3ML 23GX1 SAFETY (SYRINGE) IMPLANT
SYR BULB IRRIGATION 50ML (SYRINGE) ×3 IMPLANT
SYR CONTROL 10ML LL (SYRINGE) ×3 IMPLANT
SYRINGE 10CC LL (SYRINGE) ×3 IMPLANT
SYS BAG RETRIEVAL 10MM (BASKET) ×2
SYSTEM BAG RETRIEVAL 10MM (BASKET) ×1 IMPLANT
TOWEL OR 17X24 6PK STRL BLUE (TOWEL DISPOSABLE) ×6 IMPLANT
TRAY DSU PREP LF (CUSTOM PROCEDURE TRAY) ×3 IMPLANT
TRAY FOLEY CATH SILVER 14FR (SET/KITS/TRAYS/PACK) ×2 IMPLANT
TROCAR OPTI TIP 5M 100M (ENDOMECHANICALS) ×3 IMPLANT
TROCAR XCEL BLUNT TIP 100MML (ENDOMECHANICALS) IMPLANT
TROCAR XCEL DIL TIP R 11M (ENDOMECHANICALS) ×5 IMPLANT
TUBE CONNECTING 12X1/4 (SUCTIONS) ×3 IMPLANT
TUBING INSUFFLATION 10FT LAP (TUBING) ×3 IMPLANT
WARMER LAPAROSCOPE (MISCELLANEOUS) ×3 IMPLANT
WATER STERILE IRR 1000ML POUR (IV SOLUTION) ×3 IMPLANT
WATER STERILE IRR 500ML POUR (IV SOLUTION) ×3 IMPLANT
YANKAUER SUCT BULB TIP NO VENT (SUCTIONS) ×3 IMPLANT

## 2015-09-21 NOTE — Progress Notes (Signed)
The patient was re-examined with no change in status 

## 2015-09-21 NOTE — Anesthesia Postprocedure Evaluation (Signed)
Anesthesia Post Note  Patient: [name] RidgesJanet Berke  Procedure(s) Performed: Procedure(s) (LRB): LAPAROSCOPIC SALPINGO OOPHORECTOMY, (Left)  Patient location during evaluation: PACU Anesthesia Type: General Level of consciousness: awake and alert Pain management: pain level controlled Vital Signs Assessment: post-procedure vital signs reviewed and stable Respiratory status: spontaneous breathing, nonlabored ventilation, respiratory function stable and patient connected to nasal cannula oxygen Cardiovascular status: blood pressure returned to baseline and stable Postop Assessment: no signs of nausea or vomiting Anesthetic complications: no    Last Vitals:  Vitals:   09/21/15 0900 09/21/15 0915  BP: 129/81 124/81  Pulse: 77 79  Resp: 12 13  Temp:      Last Pain:  Vitals:   09/21/15 1000  TempSrc:   PainSc: 5                  Avacyn Kloosterman J

## 2015-09-21 NOTE — Transfer of Care (Signed)
  Last Vitals:  Vitals:   09/21/15 0603 09/21/15 0839  BP: 139/81 (!) (P) 154/88  Pulse: 77   Resp: 16 (P) 16  Temp: 36.6 C (P) 36.6 C    Last Pain:  Vitals:   09/21/15 0612  TempSrc:   PainSc: 2       Patients Stated Pain Goal: 7 (09/21/15 [id number])  Immediate Anesthesia Transfer of Care Note  Patient: Clent RidgesJanet Racette  Procedure(s) Performed: Procedure(s) (LRB): LAPAROSCOPIC SALPINGO OOPHORECTOMY, (Left)  Patient Location: PACU  Anesthesia Type: General  Level of Consciousness: awake, alert  and oriented  Airway & Oxygen Therapy: Patient Spontanous Breathing and Patient connected to face mask oxygen  Post-op Assessment: Report given to PACU RN and Post -op Vital signs reviewed and stable  Post vital signs: Reviewed and stable  Complications: No apparent anesthesia complications

## 2015-09-21 NOTE — Anesthesia Procedure Notes (Signed)
Procedure Name: Intubation Date/Time: 09/21/2015 7:38 AM Performed by: DENENNY, BRUCE Pre-anesthesia Checklist: Patient identified, Emergency Drugs available, Suction available and Patient being monitored Patient Re-evaluated:Patient Re-evaluated prior to inductionOxygen Delivery Method: Circle system utilized Preoxygenation: Pre-oxygenation with 100% oxygen Intubation Type: IV induction Ventilation: Mask ventilation without difficulty Laryngoscope Size: Mac and 3 Grade View: Grade I Tube type: Oral Tube size: 7.0 mm Number of attempts: 1 Airway Equipment and Method: Stylet and LTA kit utilized Placement Confirmation: ETT inserted through vocal cords under direct vision,  positive ETCO2 and breath sounds checked- equal and bilateral Secured at: 22 cm Tube secured with: Tape Dental Injury: Teeth and Oropharynx as per pre-operative assessment        

## 2015-09-21 NOTE — Op Note (Signed)
Preoperative diagnosis: Chronic left lower quadrant pain  Postoperative diagnosis same, plus left adnexal adhesions, small left ovarian cyst, omental adhesions.  Procedure: Diagnostic laparoscopy, lysis of adhesions, LSO  Surgeon: Marcelle OverlieHolland  EBL: Less than 50 cc  Procedure and findings:  Patient taken the operating room after an adequate level of general anesthesia was obtained the patient's legs in stirrups, the abdomen perineum and vagina were prepped and draped bladder was drained. Appropriate timeouts were taken at that point. Attention directed to the abdomen with the subumbilical area was infiltrated with quarter percent Marcaine plain, small incision was made in the varies needle was introduced without difficulty. After a 3 L pneumoperitoneum syncopated, lap scopic trocar and sleeve were then introduced without difficulty. There was no evidence of any bleeding or trauma. 3 finger breaths above the symphysis in the midline, 5 mm trocar was inserted under direct visualization. Patient was then placed in Trendelenburg and the pelvic findings as follows:  The uterus is surgically absent right tube and ovary appeared to be normal the appendix was unremarkable there were some pericecal adhesions of the upper abdomen was otherwise unremarkable. Was a solitary omental adhesion to the left lower quadrant anterior abdominal wall which was lysed in an avascular plane. The left tube and ovary were fairly mobile but there were several small clear walled cyst noted in some filmy periadnexal adhesions which were lysed. Once this was accomplished the atraumatic grasper was then used to grasp the left tube and ovary placed on traction toward the midline, the course of the left pelvic ureter was well below, the left IP ligament was then isolated and the Enseal device was used to coagulate and divide the left IP ligament freeing up the left tube and ovary. This was hemostatic. Endobag was then positioned on the left  tube and ovary were extracted without difficulty. Reinspection the operative site revealed to be hemostatic. Its was removed, gas allowed to escape, lower incision fascia closed with a 2-0 Vicryl suture 4-0 Monocryl on the skin, 4-0 Monocryl suture subcuticular at the umbilicus. She tolerated this well went to recovery room in good condition.  Dictated with [facility]  Stamatia Masri Milana ObeyM Zollie Ellery M.D.

## 2015-09-21 NOTE — Discharge Instructions (Signed)

## 2015-09-22 ENCOUNTER — Encounter (HOSPITAL_BASED_OUTPATIENT_CLINIC_OR_DEPARTMENT_OTHER): Payer: Self-pay | Admitting: Obstetrics and Gynecology

## 2015-12-22 DIAGNOSIS — L918 Other hypertrophic disorders of the skin: Secondary | ICD-10-CM | POA: Diagnosis not present

## 2015-12-22 DIAGNOSIS — Z0000 Encounter for general adult medical examination without abnormal findings: Secondary | ICD-10-CM | POA: Diagnosis not present

## 2015-12-22 DIAGNOSIS — F419 Anxiety disorder, unspecified: Secondary | ICD-10-CM | POA: Diagnosis not present

## 2015-12-22 DIAGNOSIS — K219 Gastro-esophageal reflux disease without esophagitis: Secondary | ICD-10-CM | POA: Diagnosis not present

## 2015-12-22 DIAGNOSIS — E7800 Pure hypercholesterolemia, unspecified: Secondary | ICD-10-CM | POA: Diagnosis not present

## 2015-12-25 DIAGNOSIS — Z0000 Encounter for general adult medical examination without abnormal findings: Secondary | ICD-10-CM | POA: Diagnosis not present

## 2015-12-25 DIAGNOSIS — E7800 Pure hypercholesterolemia, unspecified: Secondary | ICD-10-CM | POA: Diagnosis not present

## 2016-05-12 DIAGNOSIS — Z1231 Encounter for screening mammogram for malignant neoplasm of breast: Secondary | ICD-10-CM | POA: Diagnosis not present

## 2016-05-12 DIAGNOSIS — Z01419 Encounter for gynecological examination (general) (routine) without abnormal findings: Secondary | ICD-10-CM | POA: Diagnosis not present

## 2016-05-12 DIAGNOSIS — Z6828 Body mass index (BMI) 28.0-28.9, adult: Secondary | ICD-10-CM | POA: Diagnosis not present

## 2016-05-16 DIAGNOSIS — Z130 Encounter for screening for diseases of the blood and blood-forming organs and certain disorders involving the immune mechanism: Secondary | ICD-10-CM | POA: Diagnosis not present

## 2016-05-16 DIAGNOSIS — Z1329 Encounter for screening for other suspected endocrine disorder: Secondary | ICD-10-CM | POA: Diagnosis not present

## 2016-05-16 DIAGNOSIS — Z13228 Encounter for screening for other metabolic disorders: Secondary | ICD-10-CM | POA: Diagnosis not present

## 2016-05-16 DIAGNOSIS — Z13220 Encounter for screening for lipoid disorders: Secondary | ICD-10-CM | POA: Diagnosis not present

## 2016-05-25 ENCOUNTER — Ambulatory Visit: Payer: BLUE CROSS/BLUE SHIELD | Admitting: Diagnostic Neuroimaging

## 2016-05-30 ENCOUNTER — Other Ambulatory Visit: Payer: Self-pay | Admitting: Diagnostic Neuroimaging

## 2016-07-26 ENCOUNTER — Ambulatory Visit (INDEPENDENT_AMBULATORY_CARE_PROVIDER_SITE_OTHER): Payer: BLUE CROSS/BLUE SHIELD | Admitting: Diagnostic Neuroimaging

## 2016-07-26 ENCOUNTER — Ambulatory Visit: Payer: BLUE CROSS/BLUE SHIELD | Admitting: Diagnostic Neuroimaging

## 2016-07-26 ENCOUNTER — Encounter: Payer: Self-pay | Admitting: Diagnostic Neuroimaging

## 2016-07-26 VITALS — BP 141/86 | HR 89 | Ht 65.5 in | Wt 177.0 lb

## 2016-07-26 DIAGNOSIS — E611 Iron deficiency: Secondary | ICD-10-CM | POA: Diagnosis not present

## 2016-07-26 DIAGNOSIS — G2581 Restless legs syndrome: Secondary | ICD-10-CM

## 2016-07-26 MED ORDER — ROTIGOTINE 1 MG/24HR TD PT24: 1.0000 mg | MEDICATED_PATCH | Freq: Every day | TRANSDERMAL | 4 refills | Status: DC

## 2016-07-26 MED ORDER — ROTIGOTINE 1 MG/24HR TD PT24: 1.0000 mg | MEDICATED_PATCH | Freq: Every day | TRANSDERMAL | 11 refills | Status: DC

## 2016-07-26 NOTE — Patient Instructions (Signed)
RLS (established problem, stable) - continue neupro 1mg  patch - continue low iron replacement treatments - follow up iron levels with PCP  HYPERTENSION (white coat vs essential) - monitor BP at home and follow up with PCP - reviewed guidelines, nutrition and fitness

## 2016-07-26 NOTE — Progress Notes (Signed)
PATIENT: [name] [name] DOB: May 13, 1960   REASON FOR VISIT: follow up (RLS) HISTORY FROM: patient  HISTORY OF PRESENT ILLNESS:  UPDATE 07/26/16: Since last visit, has strained right foot in Jan 2018. RLS stable on rotigotine patch. Taking iron supplements.   UPDATE 05/26/15: Since last visit, doing well. Tolerating rotigotine. Taking iron tabs.   UPDATE 05/26/14: Doing well. Has been working out at [employer]; doing much better with her exercise tolerance. She has gained strength and lost 25lbs. Overall RLS sxs better. Doing well on neupro patch.   UPDATE 01/14/14: Since last visit, stable with RLS. Recently tried walking in a 5k, but had to wait for 2 hours in the cold before hand, and then had sig pain during the walk, and she couldn't complete it.   UPDATE 07/10/13: Since last visit, doing well. Satisfied with RLS control (neupro patch, iron replacement). Separately notes more callous/bunion in bilateral feet.  UPDATE 05/10/13: Since last visit patient reduced neupro patch to 1 mg daily. Since then swelling in legs has significantly improved. Patient was tested for iron deficiency and was found to be borderline low. She started iron 325 mg tablet, but had side effects of numbness and tingling and palpitations. She stopped iron tablet and the symptoms resolved. However the iron tablets did seem to improve her restless leg symptoms. Patient is planning to modify her diet to increase iron levels as well as try a low-dose iron supplement.  UPDATE 04/08/13 (VP): Since last visit, tried neupro 2mg  patch with some benefit in RLS. 2-3 weeks ago, stopped her iron supplement, because she was unsure about duration of therapy. Last Sunday, developed mild, gradual swelling in feet, ankles, fingers. No CP, SOB. Also with intermittent pain in legs with exertion/walking, and continue RLS symptoms at rest.   UPDATE 12/24/12 (LL):  Patient states Neupro 1 mg is working. Since the patch is working, she has been  more active. She is very happy she is able to be more active. She has noticed her legs are hurting more at night with increased activity. Requesting to know if Neupro patch dosage should be increased.   PRIOR HPI 10/24/12 (VP):  56 year old right-handed female here for evaluation of leg pain. In patient reports pain in her legs, throughout, aching and sore feeling, which is worse when she is walking. Spent or she sits down. However later in the evening she feels restless and has the urge to move her legs. In the morning when she wakes up her legs are quite stiff. Symptoms are going on for past 6-8 months. Patient tried Lyrica 50 mg at bedtime, tried 100 mg dose but could not tolerate it. Patient was diagnosed with low iron levels and is on replacement for past one month.    REVIEW OF SYSTEMS: Full 14 system review of systems performed and negative except for back pain and aching muscles.    ALLERGIES: Allergies  Allergen Reactions  . Lipitor [Atorvastatin] Other (See Comments)    Leg pain  . Lyrica [Pregabalin] Other (See Comments)    Leg pain  . Penicillins Other (See Comments)    Unknown Childhood reaction  . Strawberry Extract Hives    HOME MEDICATIONS: Outpatient Medications Prior to Visit  Medication Sig Dispense Refill  . acetaminophen (TYLENOL) 500 MG tablet Take 500 mg by mouth every 6 (six) hours as needed.    Marland Kitchen b complex vitamins tablet Take 1 tablet by mouth daily.    . calcium-vitamin D (OSCAL WITH D) 500-200  MG-UNIT per tablet Take 1 tablet by mouth daily.    . CRESTOR 5 MG tablet Take 1 tablet by mouth every morning.     . ferrous sulfate 325 (65 FE) MG EC tablet Take 325 mg by mouth daily with breakfast.    . Garlic 1000 MG CAPS Take 1 capsule by mouth daily.    Marland Kitchen. ibuprofen (ADVIL,MOTRIN) 200 MG tablet Take 400-600 mg by mouth every 8 (eight) hours as needed. For headache    . LORazepam (ATIVAN) 0.5 MG tablet Take 0.5 mg by mouth every 8 (eight) hours as needed. Pt takes  half tablet for anxiety.    . NEUPRO 1 MG/24HR PT24 APPLY 1 PATCH(1 MG) EXTERNALLY TO THE SKIN DAILY 30 patch 11  . pantoprazole (PROTONIX) 40 MG tablet Take 40 mg by mouth 2 (two) times daily.    . ST JOHNS WORT PO Take 1 capsule by mouth daily.    . vitamin C (ASCORBIC ACID) 500 MG tablet Take 500 mg by mouth daily.    Marland Kitchen. ESTRACE VAGINAL 0.1 MG/GM vaginal cream Place 1 Applicatorful vaginally.     Marland Kitchen. oxyCODONE-acetaminophen (ROXICET) 5-325 MG tablet Take 1-2 tablets by mouth every 6 (six) hours as needed for severe pain. (Patient not taking: Reported on 07/26/2016) 30 tablet 0   No facility-administered medications prior to visit.     PAST MEDICAL HISTORY: Past Medical History:  Diagnosis Date  . Anxiety   . GERD (gastroesophageal reflux disease)   . Headache(784.0)   . History of adenomatous polyp of colon    tubular adenoma 07-31-2015  . Hyperlipidemia   . Iron deficiency   . Left lower quadrant pain    chronic  . RLS (restless legs syndrome)   . Wears glasses   . White coat syndrome without hypertension     PAST SURGICAL HISTORY: Past Surgical History:  Procedure Laterality Date  . COLONOSCOPY  last one 07-31-2015  . CYSTOSCOPY  12/12/2011   Procedure: CYSTOSCOPY;  Surgeon: Meriel Picaichard M Holland, MD;  Location: [facility];  Service: Gynecology;  Laterality: N/A;  . DILATION AND CURETTAGE OF UTERUS  1988  . LAPAROSCOPIC ASSISTED VAGINAL HYSTERECTOMY  12/29/2005  . LAPAROSCOPIC SALPINGO OOPHERECTOMY Left 09/21/2015   Procedure: LAPAROSCOPIC SALPINGO OOPHORECTOMY,;  Surgeon: Richarda Overlieichard Holland, MD;  [facility];  Service: Gynecology;  Laterality: Left;  . PUBOVAGINAL SLING  12/12/2011   Procedure: Leonides GrillsPUBO-VAGINAL SLING;  Surgeon: Meriel Picaichard M Holland, MD;  Location: [facility];  Service: Gynecology;  Laterality: N/A;  Solyx Single Incision Sling    FAMILY HISTORY: Family History  Problem Relation Age of Onset  . Heart attack Brother   . Heart attack Father   . Heart  Problems Father     SOCIAL HISTORY: Social History   Social History  . Marital status: Married    Spouse name: Jena GaussHugh  . Number of children: 2  . Years of education: College   Occupational History  . 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 Smoking status: Never Smoker  . Smokeless tobacco: Never Used  . Alcohol use Yes     Comment: occasionally  . Drug use: No  . Sexual activity: Not on file   Other Topics Concern  . Not on file   Social History Narrative   Patient lives at home with family.   Caffeine Use: 4 cups of tea daily     PHYSICAL EXAM  Vitals:  07/26/16 1308 07/26/16 1312  BP: (!) 150/100 (!) 141/86  Pulse: 88 89  Weight: 177 lb (80.3 kg)   Height: 5' 5.5" (1.664 m)    Body mass index is 29.01 kg/m.  GENERAL EXAM: Patient is in no distress; well developed, nourished and groomed; neck is supple  CARDIOVASCULAR: Regular rate and rhythm, no murmurs, no carotid bruits  NEUROLOGIC: MENTAL STATUS: awake, alert, language fluent, comprehension intact, naming intact, fund of knowledge appropriate CRANIAL NERVE:  pupils equal and reactive to light, visual fields full to confrontation, extraocular muscles intact, no nystagmus, facial sensation and strength symmetric, hearing intact, palate elevates symmetrically, uvula midline, shoulder shrug symmetric, tongue midline. MOTOR: normal bulk and tone, full strength in the BUE, BLE SENSORY: normal and symmetric to light touch, temperature, vibration COORDINATION: finger-nose-finger, fine finger movements normal REFLEXES: deep tendon reflexes present and symmetric GAIT/STATION: narrow based gait    DIAGNOSTIC DATA (LABS, IMAGING, TESTING)  - None to review    ASSESSMENT AND PLAN 56 y.o. female here with restless leg syndrome and low iron levels. Neuro exam normal. Doing well on neupro 1mg  patch and iron replacement.  Dx:  Restless leg syndrome  Low iron      PLAN:  RLS (established problem, stable) - continue neupro 1mg  patch - continue low iron replacement treatments - follow up iron levels with PCP  HYPERTENSION (white coat vs essential) - monitor BP at home and follow up with PCP - reviewed guidelines, nutrition and fitness  Meds ordered this encounter  Medications  . DISCONTD: Rotigotine (NEUPRO) 1 MG/24HR PT24    Sig: Place 1 patch (1 mg total) onto the skin daily.    Dispense:  30 patch    Refill:  11  . Rotigotine (NEUPRO) 1 MG/24HR PT24    Sig: Place 1 patch (1 mg total) onto the skin daily.    Dispense:  90 patch    Refill:  4   Return in about 1 year (around 07/26/2017).   Suanne Marker, MD 07/26/2016, 1:17 PM Certified in Neurology, Neurophysiology and Neuroimaging  [facility] [address], [address] [place], [place] [address] [phone]

## 2016-07-28 DIAGNOSIS — S40861A Insect bite (nonvenomous) of right upper arm, initial encounter: Secondary | ICD-10-CM | POA: Diagnosis not present

## 2016-07-28 DIAGNOSIS — S80861A Insect bite (nonvenomous), right lower leg, initial encounter: Secondary | ICD-10-CM | POA: Diagnosis not present

## 2016-07-28 DIAGNOSIS — S40862A Insect bite (nonvenomous) of left upper arm, initial encounter: Secondary | ICD-10-CM | POA: Diagnosis not present

## 2016-07-28 DIAGNOSIS — R030 Elevated blood-pressure reading, without diagnosis of hypertension: Secondary | ICD-10-CM | POA: Diagnosis not present

## 2016-08-22 DIAGNOSIS — R030 Elevated blood-pressure reading, without diagnosis of hypertension: Secondary | ICD-10-CM | POA: Diagnosis not present

## 2016-08-22 DIAGNOSIS — M79671 Pain in right foot: Secondary | ICD-10-CM | POA: Diagnosis not present

## 2016-08-22 DIAGNOSIS — M79672 Pain in left foot: Secondary | ICD-10-CM | POA: Diagnosis not present

## 2016-10-19 DIAGNOSIS — Z23 Encounter for immunization: Secondary | ICD-10-CM | POA: Diagnosis not present

## 2016-10-20 ENCOUNTER — Emergency Department (HOSPITAL_COMMUNITY): Payer: BLUE CROSS/BLUE SHIELD

## 2016-10-20 ENCOUNTER — Encounter (HOSPITAL_COMMUNITY): Payer: Self-pay | Admitting: Emergency Medicine

## 2016-10-20 ENCOUNTER — Emergency Department (HOSPITAL_COMMUNITY)
Admission: EM | Admit: 2016-10-20 | Discharge: 2016-10-20 | Disposition: A | Discharge status: Home / Self Care | Payer: BLUE CROSS/BLUE SHIELD | Attending: Emergency Medicine | Admitting: Emergency Medicine

## 2016-10-20 DIAGNOSIS — Y999 Unspecified external cause status: Secondary | ICD-10-CM | POA: Insufficient documentation

## 2016-10-20 DIAGNOSIS — W102XXA Fall (on)(from) incline, initial encounter: Secondary | ICD-10-CM | POA: Diagnosis not present

## 2016-10-20 DIAGNOSIS — Z79899 Other long term (current) drug therapy: Secondary | ICD-10-CM | POA: Diagnosis not present

## 2016-10-20 DIAGNOSIS — Y93H2 Activity, gardening and landscaping: Secondary | ICD-10-CM | POA: Insufficient documentation

## 2016-10-20 DIAGNOSIS — Z7982 Long term (current) use of aspirin: Secondary | ICD-10-CM | POA: Diagnosis not present

## 2016-10-20 DIAGNOSIS — S52591A Other fractures of lower end of right radius, initial encounter for closed fracture: Secondary | ICD-10-CM

## 2016-10-20 DIAGNOSIS — S59201A Unspecified physeal fracture of lower end of radius, right arm, initial encounter for closed fracture: Secondary | ICD-10-CM | POA: Diagnosis not present

## 2016-10-20 DIAGNOSIS — Y92007 Garden or yard of unspecified non-institutional (private) residence as the place of occurrence of the external cause: Secondary | ICD-10-CM | POA: Diagnosis not present

## 2016-10-20 DIAGNOSIS — S52502A Unspecified fracture of the lower end of left radius, initial encounter for closed fracture: Secondary | ICD-10-CM | POA: Diagnosis not present

## 2016-10-20 DIAGNOSIS — S6991XA Unspecified injury of right wrist, hand and finger(s), initial encounter: Secondary | ICD-10-CM | POA: Diagnosis not present

## 2016-10-20 MED ORDER — OXYCODONE-ACETAMINOPHEN 5-325 MG PO TABS
1.0000 | ORAL_TABLET | Freq: Once | ORAL | Status: AC
  Administered 2016-10-20: 1 via ORAL

## 2016-10-20 MED ORDER — OXYCODONE-ACETAMINOPHEN 5-325 MG PO TABS
ORAL_TABLET | ORAL | Status: AC
  Filled 2016-10-20: qty 1

## 2016-10-20 MED ORDER — OXYCODONE-ACETAMINOPHEN 5-325 MG PO TABS: 1.0000 | ORAL_TABLET | ORAL | 0 refills | Status: DC | PRN

## 2016-10-20 NOTE — Progress Notes (Signed)
Orthopedic Tech Progress Note Patient Details:  [name] [name] 1960-02-18 [id number]  Ortho Devices Type of Ortho Device: Ace wrap, Arm sling, Sugartong splint Ortho Device/Splint Location: LUE Ortho Device/Splint Interventions: Ordered, Application   [name] 10/20/2016, 2:36 PM

## 2016-10-20 NOTE — ED Triage Notes (Signed)
Pt slipped on ramp and landed on left wrist; did not hit head or pass out. Swelling to left wrist. Warm and pulses. She has hx of broken wrist in 1992.

## 2016-10-20 NOTE — ED Provider Notes (Signed)
[facility]-EMER[facility]ENCY DEPT Provider Note   CSN: [id number] Arrival date & time: 10/20/16  1029     History   Chief Complaint Chief Complaint  Patient presents with  . Wrist Pain    HPI [name] [name] is a 56 y.o. female.  The history is provided by the patient and medical records.    56 year old female with history of anxiety, GERD, hyperlipidemia, restless leg syndrome, presenting to the ED with left wrist pain.  Patient states she was putting up her new mower into the shed when she slipped and fell on the ramp landing on the left wrist.  States immediate onset of pain and swelling. She does have deformity noted of the left wrist. Reports she broke her left wrist in the 90s while she was living in [place]. She denies any numbness or weakness of her left hand.  Past Medical History:  Diagnosis Date  . Anxiety   . GERD (gastroesophageal reflux disease)   . Headache(784.0)   . History of adenomatous polyp of colon    tubular adenoma 07-31-2015  . Hyperlipidemia   . Iron deficiency   . Left lower quadrant pain    chronic  . RLS (restless legs syndrome)   . Wears glasses   . White coat syndrome without hypertension     Patient Active Problem List   Diagnosis Date Noted  . Restless leg syndrome 10/24/2012    Past Surgical History:  Procedure Laterality Date  . COLONOSCOPY  last one 07-31-2015  . CYSTOSCOPY  12/12/2011   Procedure: CYSTOSCOPY;  Surgeon: Meriel Pica, MD;  Location: [facility];  Service: Gynecology;  Laterality: N/A;  . DILATION AND CURETTAGE OF UTERUS  1988  . LAPAROSCOPIC ASSISTED VAGINAL HYSTERECTOMY  12/29/2005  . LAPAROSCOPIC SALPINGO OOPHERECTOMY Left 09/21/2015   Procedure: LAPAROSCOPIC SALPINGO OOPHORECTOMY,;  Surgeon: Richarda Overlie, MD;  Location: [facility];  Service: Gynecology;  Laterality: Left;  . PUBOVAGINAL SLING  12/12/2011   Procedure: Leonides Grills;  Surgeon: Meriel Pica, MD;  Location: [facility];  Service:  Gynecology;  Laterality: N/A;  Solyx Single Incision Sling    OB History    No data available       Home Medications    Prior to Admission medications   Medication Sig Start Date End Date Taking? Authorizing Provider  acetaminophen (TYLENOL) 500 MG tablet Take 500 mg by mouth every 6 (six) hours as needed.    [provider]  aspirin 81 MG chewable tablet Chew by mouth daily.    [provider]  b complex vitamins tablet Take 1 tablet by mouth daily.    [provider]  calcium-vitamin D (OSCAL WITH D) 500-200 MG-UNIT per tablet Take 1 tablet by mouth daily.    [provider]  CRESTOR 5 MG tablet Take 1 tablet by mouth every morning.  10/24/13   [provider]  ferrous sulfate 325 (65 FE) MG EC tablet Take 325 mg by mouth daily with breakfast.    [provider]  Garlic 1000 MG CAPS Take 1 capsule by mouth daily.    [provider]  ibuprofen (ADVIL,MOTRIN) 200 MG tablet Take 400-600 mg by mouth every 8 (eight) hours as needed. For headache    [provider]  LORazepam (ATIVAN) 0.5 MG tablet Take 0.5 mg by mouth every 8 (eight) hours as needed. Pt takes half tablet for anxiety.    [provider]  pantoprazole (PROTONIX) 40 MG tablet Take 40 mg by mouth  2 (two) times daily.    [provider]  Rotigotine (NEUPRO) 1 MG/24HR PT24 Place 1 patch (1 mg total) onto the skin daily. 07/26/16   Penumalli, Glenford Bayley, MD  ST JOHNS WORT PO Take 1 capsule by mouth daily.    [provider]  vitamin C (ASCORBIC ACID) 500 MG tablet Take 500 mg by mouth daily.    [provider]    Family History Family History  Problem Relation Age of Onset  . Heart attack Brother   . Heart attack Father   . Heart Problems Father     Social History Social History  Substance Use Topics  . Smoking status: Never Smoker  . Smokeless tobacco: Never Used  . Alcohol use Yes     Comment: occasionally      Allergies   Lipitor [atorvastatin]; Lyrica [pregabalin]; Penicillins; and Strawberry extract   Review of Systems Review of Systems  Musculoskeletal: Positive for arthralgias.  All other systems reviewed and are negative.    Physical Exam Updated Vital Signs BP (!) 169/103 (BP Location: Right Arm)   Pulse 95   Temp 98.2 F (36.8 C) (Oral)   Resp 18   SpO2 99%   Physical Exam  Constitutional: She is oriented to person, place, and time. She appears well-developed and well-nourished.  HENT:  Head: Normocephalic and atraumatic.  Mouth/Throat: Oropharynx is clear and moist.  Eyes: Pupils are equal, round, and reactive to light. Conjunctivae and EOM are normal.  Neck: Normal range of motion.  Cardiovascular: Normal rate, regular rhythm and normal heart sounds.   Pulmonary/Chest: Effort normal and breath sounds normal.  Abdominal: Soft. Bowel sounds are normal.  Musculoskeletal: Normal range of motion.  Left wrist with deformity, volar angulation noted, swelling and bruising present, normal radial pulse, moving all fingers normally  Neurological: She is alert and oriented to person, place, and time.  Skin: Skin is warm and dry.  Psychiatric: She has a normal mood and affect.  Nursing note and vitals reviewed.    ED Treatments / Results  Labs (all labs ordered are listed, but only abnormal results are displayed) Labs Reviewed - No data to display  EKG  EKG Interpretation None       Radiology Dg Wrist Complete Left  Result Date: 10/20/2016 CLINICAL DATA:  Status post fall this morning striking the wrist. Severe pain. EXAM: LEFT WRIST - COMPLETE 3+ VIEW COMPARISON:  None in PACs FINDINGS: The patient has sustained an angulated partially impacted and mildly distracted fracture of the distal left radial metaphysis. The fracture lines do not clearly involve the joint space. There is an old ulnar styloid fracture. The carpal bones are intact. The metacarpal bases appear  normal. IMPRESSION: There is an acute mildly angulated fracture of the distal left radial metaphysis is apex volar. There is an old ulnar styloid fracture. Electronically Signed   By: David  [place] M.D.   On: 10/20/2016 12:29    Procedures Procedures (including critical care time)  Medications Ordered in ED Medications  oxyCODONE-acetaminophen (PERCOCET/ROXICET) 5-325 MG per tablet (not administered)  oxyCODONE-acetaminophen (PERCOCET/ROXICET) 5-325 MG per tablet 1 tablet (1 tablet Oral Given 10/20/16 1104)     Initial Impression / Assessment and Plan / ED Course  I have reviewed the triage vital signs and the nursing notes.  Pertinent labs & imaging results that were available during my care of the patient were reviewed by me and considered in my medical decision making (see chart for details).  56 year old  Female here with fall on outstretched left hand this morning. No head injury or loss of consciousness. She has obvious deformity of the left wrist with volar angulation. There is a lot of swelling and bruising noted. Radial pulses intact, normal sensation and cap refill. Moving fingers normally. Screening x-ray obtained revealing mildly angulated fracture of the left distal radius.  Patient's husband unfortunately was recently diagnosed with cancer and is scheduled to have back surgery mildly [facility]. She is concerned about her abilities to care for him after surgery given her injuries today.  Discussed with on call hand surgery, Dr. Merlyn Lot-- he has independently reviewed x-rays and we have discussed her exam findings, recommends splint for now. He can see her next week or whenever feasible within the next 1.5- 2 weeks given the extenuating circumstances with her husband.  Patient placed in sugar tong splint here with arm sling.  She understands to call hand surgeons office for follow-up.  Rx percocet. Discussed plan with patient, she acknowledged understanding and agreed with plan of care.   Return precautions given for new or worsening symptoms.  Final Clinical Impressions(s) / ED Diagnoses   Final diagnoses:  Other closed fracture of distal end of right radius, initial encounter    New Prescriptions New Prescriptions   OXYCODONE-ACETAMINOPHEN (PERCOCET) 5-325 MG TABLET    Take 1 tablet by mouth every 4 (four) hours as needed.     Garlon Hatchet, PA-C 10/20/16 1508    Gerhard Munch, MD 10/20/16 [phone]

## 2016-10-20 NOTE — Discharge Instructions (Signed)
Take the prescribed medication as directed. Follow-up with Dr. Merlyn Lot-- call his office to make an appt. Return to the ED for new or worsening symptoms.

## 2016-10-25 DIAGNOSIS — S52552A Other extraarticular fracture of lower end of left radius, initial encounter for closed fracture: Secondary | ICD-10-CM | POA: Diagnosis not present

## 2016-10-26 ENCOUNTER — Other Ambulatory Visit: Payer: Self-pay | Admitting: Orthopedic Surgery

## 2016-10-26 ENCOUNTER — Encounter (HOSPITAL_BASED_OUTPATIENT_CLINIC_OR_DEPARTMENT_OTHER): Payer: Self-pay | Admitting: *Deleted

## 2016-10-27 ENCOUNTER — Ambulatory Visit (HOSPITAL_BASED_OUTPATIENT_CLINIC_OR_DEPARTMENT_OTHER)
Admission: RE | Admit: 2016-10-27 | Discharge: 2016-10-27 | Disposition: A | Discharge status: Home / Self Care | Payer: BLUE CROSS/BLUE SHIELD | Source: Ambulatory Visit | Attending: Orthopedic Surgery | Admitting: Orthopedic Surgery

## 2016-10-27 ENCOUNTER — Encounter (HOSPITAL_BASED_OUTPATIENT_CLINIC_OR_DEPARTMENT_OTHER): Payer: Self-pay | Admitting: Certified Registered Nurse Anesthetist

## 2016-10-27 ENCOUNTER — Ambulatory Visit (HOSPITAL_BASED_OUTPATIENT_CLINIC_OR_DEPARTMENT_OTHER): Payer: BLUE CROSS/BLUE SHIELD | Admitting: Anesthesiology

## 2016-10-27 ENCOUNTER — Encounter (HOSPITAL_BASED_OUTPATIENT_CLINIC_OR_DEPARTMENT_OTHER)
Admission: RE | Disposition: A | Discharge status: Home / Self Care | Payer: Self-pay | Source: Ambulatory Visit | Attending: Orthopedic Surgery

## 2016-10-27 DIAGNOSIS — Z7982 Long term (current) use of aspirin: Secondary | ICD-10-CM | POA: Insufficient documentation

## 2016-10-27 DIAGNOSIS — R51 Headache: Secondary | ICD-10-CM | POA: Diagnosis not present

## 2016-10-27 DIAGNOSIS — Z888 Allergy status to other drugs, medicaments and biological substances status: Secondary | ICD-10-CM | POA: Diagnosis not present

## 2016-10-27 DIAGNOSIS — E611 Iron deficiency: Secondary | ICD-10-CM | POA: Insufficient documentation

## 2016-10-27 DIAGNOSIS — Z880 Allergy status to penicillin: Secondary | ICD-10-CM | POA: Insufficient documentation

## 2016-10-27 DIAGNOSIS — S52552A Other extraarticular fracture of lower end of left radius, initial encounter for closed fracture: Secondary | ICD-10-CM | POA: Diagnosis not present

## 2016-10-27 DIAGNOSIS — E785 Hyperlipidemia, unspecified: Secondary | ICD-10-CM | POA: Insufficient documentation

## 2016-10-27 DIAGNOSIS — W19XXXA Unspecified fall, initial encounter: Secondary | ICD-10-CM | POA: Diagnosis not present

## 2016-10-27 DIAGNOSIS — K219 Gastro-esophageal reflux disease without esophagitis: Secondary | ICD-10-CM | POA: Insufficient documentation

## 2016-10-27 DIAGNOSIS — Z79899 Other long term (current) drug therapy: Secondary | ICD-10-CM | POA: Diagnosis not present

## 2016-10-27 DIAGNOSIS — F419 Anxiety disorder, unspecified: Secondary | ICD-10-CM | POA: Diagnosis not present

## 2016-10-27 DIAGNOSIS — G2581 Restless legs syndrome: Secondary | ICD-10-CM | POA: Insufficient documentation

## 2016-10-27 DIAGNOSIS — G8918 Other acute postprocedural pain: Secondary | ICD-10-CM | POA: Diagnosis not present

## 2016-10-27 DIAGNOSIS — S52502A Unspecified fracture of the lower end of left radius, initial encounter for closed fracture: Secondary | ICD-10-CM | POA: Diagnosis not present

## 2016-10-27 HISTORY — PX: OPEN REDUCTION INTERNAL FIXATION (ORIF) DISTAL RADIAL FRACTURE: SHX5989

## 2016-10-27 HISTORY — DX: Anemia, unspecified: D64.9

## 2016-10-27 SURGERY — OPEN REDUCTION INTERNAL FIXATION (ORIF) DISTAL RADIUS FRACTURE
Anesthesia: General | Site: Arm Lower | Laterality: Left

## 2016-10-27 MED ORDER — FENTANYL CITRATE (PF) 100 MCG/2ML IJ SOLN
INTRAMUSCULAR | Status: AC
  Filled 2016-10-27: qty 2

## 2016-10-27 MED ORDER — LIDOCAINE 2% (20 MG/ML) 5 ML SYRINGE
INTRAMUSCULAR | Status: DC | PRN
  Administered 2016-10-27: 40 mg via INTRAVENOUS

## 2016-10-27 MED ORDER — PROPOFOL 10 MG/ML IV BOLUS
INTRAVENOUS | Status: DC | PRN
  Administered 2016-10-27: 150 mg via INTRAVENOUS

## 2016-10-27 MED ORDER — ONDANSETRON HCL 4 MG/2ML IJ SOLN
INTRAMUSCULAR | Status: AC
  Filled 2016-10-27: qty 2

## 2016-10-27 MED ORDER — ROPIVACAINE HCL 7.5 MG/ML IJ SOLN
INTRAMUSCULAR | Status: DC | PRN
  Administered 2016-10-27: 20 mL via PERINEURAL

## 2016-10-27 MED ORDER — OXYCODONE-ACETAMINOPHEN 5-325 MG PO TABS: ORAL_TABLET | ORAL | 0 refills | Status: DC

## 2016-10-27 MED ORDER — CHLORHEXIDINE GLUCONATE 4 % EX LIQD: 60.0000 mL | Freq: Once | CUTANEOUS | Status: DC

## 2016-10-27 MED ORDER — LIDOCAINE 2% (20 MG/ML) 5 ML SYRINGE
INTRAMUSCULAR | Status: AC
  Filled 2016-10-27: qty 5

## 2016-10-27 MED ORDER — MIDAZOLAM HCL 2 MG/2ML IJ SOLN
1.0000 mg | INTRAMUSCULAR | Status: DC | PRN
  Administered 2016-10-27: 2 mg via INTRAVENOUS

## 2016-10-27 MED ORDER — DEXAMETHASONE SODIUM PHOSPHATE 10 MG/ML IJ SOLN
INTRAMUSCULAR | Status: DC | PRN
  Administered 2016-10-27: 10 mg via INTRAVENOUS

## 2016-10-27 MED ORDER — DEXAMETHASONE SODIUM PHOSPHATE 10 MG/ML IJ SOLN
INTRAMUSCULAR | Status: AC
  Filled 2016-10-27: qty 1

## 2016-10-27 MED ORDER — VANCOMYCIN HCL IN DEXTROSE 1-5 GM/200ML-% IV SOLN
INTRAVENOUS | Status: AC
  Filled 2016-10-27: qty 200

## 2016-10-27 MED ORDER — FENTANYL CITRATE (PF) 100 MCG/2ML IJ SOLN
50.0000 ug | INTRAMUSCULAR | Status: AC | PRN
  Administered 2016-10-27 (×2): 25 ug via INTRAVENOUS
  Administered 2016-10-27: 50 ug via INTRAVENOUS

## 2016-10-27 MED ORDER — LACTATED RINGERS IV SOLN
INTRAVENOUS | Status: DC
  Administered 2016-10-27 (×3): via INTRAVENOUS

## 2016-10-27 MED ORDER — PROPOFOL 10 MG/ML IV BOLUS
INTRAVENOUS | Status: AC
  Filled 2016-10-27: qty 20

## 2016-10-27 MED ORDER — VANCOMYCIN HCL IN DEXTROSE 1-5 GM/200ML-% IV SOLN
1000.0000 mg | INTRAVENOUS | Status: AC
  Administered 2016-10-27: 1000 mg via INTRAVENOUS

## 2016-10-27 MED ORDER — SCOPOLAMINE 1 MG/3DAYS TD PT72: 1.0000 | MEDICATED_PATCH | Freq: Once | TRANSDERMAL | Status: DC | PRN

## 2016-10-27 MED ORDER — ONDANSETRON HCL 4 MG/2ML IJ SOLN
INTRAMUSCULAR | Status: DC | PRN
Start: 2016-10-27 — End: 2016-10-27
  Administered 2016-10-27: 4 mg via INTRAVENOUS

## 2016-10-27 MED ORDER — MIDAZOLAM HCL 2 MG/2ML IJ SOLN
INTRAMUSCULAR | Status: AC
  Filled 2016-10-27: qty 2

## 2016-10-27 MED ORDER — 0.9 % SODIUM CHLORIDE (POUR BTL) OPTIME
TOPICAL | Status: DC | PRN
  Administered 2016-10-27: 1000 mL

## 2016-10-27 SURGICAL SUPPLY — 60 items
BANDAGE ACE 3X5.8 VEL STRL LF (GAUZE/BANDAGES/DRESSINGS) ×3 IMPLANT
BIT DRILL 2.0 LNG QUCK RELEASE (BIT) IMPLANT
BIT DRILL 2.8X5 QR DISP (BIT) ×2 IMPLANT
BLADE SURG 15 STRL LF DISP TIS (BLADE) ×2 IMPLANT
BLADE SURG 15 STRL SS (BLADE) ×6
BNDG CMPR 9X4 STRL LF SNTH (GAUZE/BANDAGES/DRESSINGS) ×1
BNDG ESMARK 4X9 LF (GAUZE/BANDAGES/DRESSINGS) ×3 IMPLANT
BNDG GAUZE ELAST 4 BULKY (GAUZE/BANDAGES/DRESSINGS) ×3 IMPLANT
BNDG PLASTER X FAST 3X3 WHT LF (CAST SUPPLIES) ×30 IMPLANT
BNDG PLSTR 9X3 FST ST WHT (CAST SUPPLIES) ×10
CHLORAPREP W/TINT 26ML (MISCELLANEOUS) ×3 IMPLANT
CORD BIPOLAR FORCEPS 12FT (ELECTRODE) ×3 IMPLANT
COVER BACK TABLE 60X90IN (DRAPES) ×3 IMPLANT
COVER MAYO STAND STRL (DRAPES) ×3 IMPLANT
CUFF TOURNIQUET SINGLE 18IN (TOURNIQUET CUFF) ×2 IMPLANT
CUFF TOURNIQUET SINGLE 24IN (TOURNIQUET CUFF) IMPLANT
DRAPE EXTREMITY T 121X128X90 (DRAPE) ×3 IMPLANT
DRAPE OEC MINIVIEW 54X84 (DRAPES) ×3 IMPLANT
DRAPE SURG 17X23 STRL (DRAPES) ×3 IMPLANT
DRILL 2.0 LNG QUICK RELEASE (BIT) ×3
GAUZE SPONGE 4X4 12PLY STRL (GAUZE/BANDAGES/DRESSINGS) ×3 IMPLANT
GAUZE XEROFORM 1X8 LF (GAUZE/BANDAGES/DRESSINGS) ×3 IMPLANT
GLOVE BIO SURGEON STRL SZ7.5 (GLOVE) ×3 IMPLANT
GLOVE BIOGEL PI IND STRL 7.0 (GLOVE) IMPLANT
GLOVE BIOGEL PI IND STRL 8 (GLOVE) ×1 IMPLANT
GLOVE BIOGEL PI IND STRL 8.5 (GLOVE) IMPLANT
GLOVE BIOGEL PI INDICATOR 7.0 (GLOVE) ×4
GLOVE BIOGEL PI INDICATOR 8 (GLOVE) ×2
GLOVE BIOGEL PI INDICATOR 8.5 (GLOVE) ×2
GLOVE ECLIPSE 6.5 STRL STRAW (GLOVE) ×2 IMPLANT
GLOVE SURG ORTHO 8.0 STRL STRW (GLOVE) ×2 IMPLANT
GOWN STRL REUS W/ TWL LRG LVL3 (GOWN DISPOSABLE) ×1 IMPLANT
GOWN STRL REUS W/TWL LRG LVL3 (GOWN DISPOSABLE) ×6
GOWN STRL REUS W/TWL XL LVL3 (GOWN DISPOSABLE) ×5 IMPLANT
GUIDEWIRE ORTHO 0.054X6 (WIRE) ×6 IMPLANT
NDL HYPO 25X1 1.5 SAFETY (NEEDLE) IMPLANT
NEEDLE HYPO 25X1 1.5 SAFETY (NEEDLE) IMPLANT
NS IRRIG 1000ML POUR BTL (IV SOLUTION) ×3 IMPLANT
PACK BASIN DAY SURGERY FS (CUSTOM PROCEDURE TRAY) ×3 IMPLANT
PAD CAST 3X4 CTTN HI CHSV (CAST SUPPLIES) ×1 IMPLANT
PADDING CAST COTTON 3X4 STRL (CAST SUPPLIES) ×3
PLATE ACU LOC PROX STD LEFT (Plate) ×2 IMPLANT
SCREW CORT 24X2.3XNONTOGGLE (Screw) IMPLANT
SCREW CORT FT 18X2.3XLCK HEX (Screw) IMPLANT
SCREW CORTICAL 2.3X24 (Screw) ×3 IMPLANT
SCREW CORTICAL LOCKING 2.3X18M (Screw) ×6 IMPLANT
SCREW CORTICAL LOCKING 2.3X20M (Screw) ×12 IMPLANT
SCREW FX20X2.3XSMTH LCK NS CRT (Screw) IMPLANT
SCREW HEX 3.5X15 NLCKG STRL (Screw) IMPLANT
SCREW HEX 3.5X15MM (Screw) ×3 IMPLANT
SCREW NONLOCK HEX 3.5X12 (Screw) ×4 IMPLANT
SLEEVE SCD COMPRESS KNEE MED (MISCELLANEOUS) ×2 IMPLANT
STOCKINETTE 4X48 STRL (DRAPES) ×3 IMPLANT
SUT ETHILON 4 0 PS 2 18 (SUTURE) ×3 IMPLANT
SUT VICRYL 4-0 PS2 18IN ABS (SUTURE) ×3 IMPLANT
SYR BULB 3OZ (MISCELLANEOUS) ×3 IMPLANT
SYR CONTROL 10ML LL (SYRINGE) IMPLANT
TOWEL OR 17X24 6PK STRL BLUE (TOWEL DISPOSABLE) ×6 IMPLANT
TOWEL OR NON WOVEN STRL DISP B (DISPOSABLE) ×3 IMPLANT
UNDERPAD 30X30 (UNDERPADS AND DIAPERS) ×3 IMPLANT

## 2016-10-27 NOTE — Discharge Instructions (Addendum)
° °  ° ° ° °[facility] Instructions °Hand Surgery ° °Wound Care: °Keep your hand elevated above the level of your heart.  Do not allow it to dangle by your side.  Keep the dressing dry and do not remove it unless your doctor advises you to do so.  He will usually change it at the time of your post-op visit.  Moving your fingers is advised to stimulate circulation but will depend on the site of your surgery.  If you have a splint applied, your doctor will advise you regarding movement. ° °Activity: °Do not drive or operate machinery today.  Rest today and then you may return to your normal activity and work as indicated by your physician. ° °Diet:  °Drink liquids today or eat a light diet.  You may resume a regular diet tomorrow.   ° °General expectations: °Pain for two to three days. °Fingers may become slightly swollen. ° °Call your doctor if any of the following occur: °Severe pain not relieved by pain medication. °Elevated temperature. °Dressing soaked with blood. °Inability to move fingers. °White or bluish color to fingers. ° ° °Regional Anesthesia Blocks ° °1. Numbness or the inability to move the "blocked" extremity may last from 3-48 hours after placement. The length of time depends on the medication injected and your individual response to the medication. If the numbness is not going away after 48 hours, call your surgeon. ° °2. The extremity that is blocked will need to be protected until the numbness is gone and the  Strength has returned. Because you cannot feel it, you will need to take extra care to avoid injury. Because it may be weak, you may have difficulty moving it or using it. You may not know what position it is in without looking at it while the block is in effect. ° °3. For blocks in the legs and feet, returning to weight bearing and walking needs to be done carefully. You will need to wait until the numbness is entirely gone and the strength has returned. You should be able to move your leg  and foot normally before you try and bear weight or walk. You will need someone to be with you when you first try to ensure you do not fall and possibly risk injury. ° °4. Bruising and tenderness at the needle site are common side effects and will resolve in a few days. ° °5. Persistent numbness or new problems with movement should be communicated to the surgeon or the [facility] ([phone])/ [facility] ([phone]). ° ° ° °Post Anesthesia Home Care Instructions ° °Activity: °Get plenty of rest for the remainder of the day. A responsible individual must stay with you for 24 hours following the procedure.  °For the next 24 hours, DO NOT: °-Drive a car °-Operate machinery °-Drink alcoholic beverages °-Take any medication unless instructed by your physician °-Make any legal decisions or sign important papers. ° °Meals: °Start with liquid foods such as gelatin or soup. Progress to regular foods as tolerated. Avoid greasy, spicy, heavy foods. If nausea and/or vomiting occur, drink only clear liquids until the nausea and/or vomiting subsides. Call your physician if vomiting continues. ° °Special Instructions/Symptoms: °Your throat may feel dry or sore from the anesthesia or the breathing tube placed in your throat during surgery. If this causes discomfort, gargle with warm salt water. The discomfort should disappear within 24 hours. ° °If you had a scopolamine patch placed behind your ear for   the management of post- operative nausea and/or vomiting: ° °1. The medication in the patch is effective for 72 hours, after which it should be removed.  Wrap patch in a tissue and discard in the trash. Wash hands thoroughly with soap and water. °2. You may remove the patch earlier than 72 hours if you experience unpleasant side effects which may include dry mouth, dizziness or visual disturbances. °3. Avoid touching the patch. Wash your hands with soap and water after contact with the patch. °  ° ° °

## 2016-10-27 NOTE — Brief Op Note (Signed)
10/27/2016  2:40 PM  PATIENT:  [name] [name]  55 y.o. female  PRE-OPERATIVE DIAGNOSIS:  LEFT DISTAL RADIUS FRACTURE  POST-OPERATIVE DIAGNOSIS:  LEFT DISTAL RADIUS FRACTURE  PROCEDURE:  Procedure(s): OPEN REDUCTION INTERNAL FIXATION (ORIF) DISTAL RADIAL FRACTURE (Left)  SURGEON:  Surgeon(s) and Role:    * Betha Loa, MD - Primary    * Cindee Salt, MD - Assisting  PHYSICIAN ASSISTANT:   ASSISTANTS: Cindee Salt, MD   ANESTHESIA:   regional and general  EBL:  Total I/O In: 1000 [I.V.:1000] Out: -   BLOOD ADMINISTERED:none  DRAINS: none   LOCAL MEDICATIONS USED:  NONE  SPECIMEN:  No Specimen  DISPOSITION OF SPECIMEN:  N/A  COUNTS:  YES  TOURNIQUET:   Total Tourniquet Time Documented: Upper Arm (Left) - 53 minutes Total: Upper Arm (Left) - 53 minutes   DICTATION: .Note written in EPIC  PLAN OF CARE: Discharge to home after PACU  PATIENT DISPOSITION:  PACU - hemodynamically stable.

## 2016-10-27 NOTE — Anesthesia Procedure Notes (Signed)
Anesthesia Regional Block: Supraclavicular block   Pre-Anesthetic Checklist: ,, timeout performed, Correct Patient, Correct Site, Correct Laterality, Correct Procedure, Correct Position, site marked, Risks and benefits discussed,  Surgical consent,  Pre-op evaluation,  At surgeon's request and post-op pain management  Laterality: Left  Prep: chloraprep       Needles:  Injection technique: Single-shot  Needle Type: Echogenic Needle     Needle Length: 9cm  Needle Gauge: 21     Additional Needles:   Procedures:,,,, ultrasound used (permanent image in chart),,,,  Narrative:  Start time: 10/27/2016 1:02 PM End time: 10/27/2016 1:07 PM Injection made incrementally with aspirations every 5 mL.  Performed by: Personally  Anesthesiologist: Cecile Hearing  Additional Notes: No pain on injection. No increased resistance to injection. Injection made in 5cc increments.  Good needle visualization.  Patient tolerated procedure well.

## 2016-10-27 NOTE — Anesthesia Preprocedure Evaluation (Addendum)
Anesthesia Evaluation  Patient identified by MRN, date of birth, ID band Patient awake    Reviewed: Allergy & Precautions, NPO status , Patient's Chart, lab work & pertinent test results  Airway Mallampati: II  TM Distance: >3 FB Neck ROM: Full    Dental  (+) Teeth Intact, Dental Advisory Given   Pulmonary neg pulmonary ROS,    Pulmonary exam normal breath sounds clear to auscultation       Cardiovascular negative cardio ROS Normal cardiovascular exam Rhythm:Regular Rate:Normal     Neuro/Psych  Headaches, PSYCHIATRIC DISORDERS Anxiety    GI/Hepatic Neg liver ROS, GERD  Medicated,  Endo/Other  negative endocrine ROS  Renal/GU negative Renal ROS     Musculoskeletal negative musculoskeletal ROS (+)   Abdominal   Peds  Hematology negative hematology ROS (+)   Anesthesia Other Findings Day of surgery medications reviewed with the patient.  Reproductive/Obstetrics                             Anesthesia Physical Anesthesia Plan  ASA: II  Anesthesia Plan: General   Post-op Pain Management:  Regional for Post-op pain   Induction: Intravenous  PONV Risk Score and Plan: 3 and Ondansetron, Dexamethasone and Midazolam  Airway Management Planned: LMA  Additional Equipment:   Intra-op Plan:   Post-operative Plan: Extubation in OR  Informed Consent: I have reviewed the patients History and Physical, chart, labs and discussed the procedure including the risks, benefits and alternatives for the proposed anesthesia with the patient or authorized representative who has indicated his/her understanding and acceptance.   Dental advisory given  Plan Discussed with: CRNA  Anesthesia Plan Comments: (Risks/benefits of general anesthesia discussed with patient including risk of damage to teeth, lips, gum, and tongue, nausea/vomiting, allergic reactions to medications, and the possibility of heart  attack, stroke and death.  All patient questions answered.  Patient wishes to proceed.)        Anesthesia Quick Evaluation

## 2016-10-27 NOTE — Transfer of Care (Signed)
Immediate Anesthesia Transfer of Care Note  Patient: [name] [name]  Procedure(s) Performed: OPEN REDUCTION INTERNAL FIXATION (ORIF) DISTAL RADIAL FRACTURE (Left Arm Lower)  Patient Location: PACU  Anesthesia Type:GA combined with regional for post-op pain  Level of Consciousness: sedated  Airway & Oxygen Therapy: Patient Spontanous Breathing and Patient connected to face mask oxygen  Post-op Assessment: Report given to RN and Post -op Vital signs reviewed and stable  Post vital signs: Reviewed and stable  Last Vitals:  Vitals:   10/27/16 1310 10/27/16 1311  BP:    Pulse: 86 87  Resp: 12 19  Temp:    SpO2: 100% 100%    Last Pain:  Vitals:   10/27/16 1236  TempSrc: Oral  PainSc: 3       Patients Stated Pain Goal: 1 (10/27/16 1236)  Complications: No apparent anesthesia complications

## 2016-10-27 NOTE — Op Note (Addendum)
10/27/2016 [facility]  Operative Note  Pre Op Diagnosis: Left extraarticular distal radius fracture  Post Op Diagnosis: Left extraarticular distal radius fracture  Procedure: ORIF Left extraarticular distal radius fracture  Surgeon: Betha Loa, MD  Assistant: Cindee Salt, MD  Anesthesia: General and Regional  Fluids: Per anesthesia flow sheet  EBL: minimal  Complications: None  Specimen: None  Tourniquet Time:  Total Tourniquet Time Documented: Upper Arm (Left) - 53 minutes Total: Upper Arm (Left) - 53 minutes   Disposition: Stable to PACU  INDICATIONS:  [name] [name] is a 56 y.o. female who fell from a standing height one week ago.  Seen at ED where XR revealed distal radius fracture.  Splinted and followed up in office.  We discussed nonoperative and operative treatment options.  She wished to proceed with operative fixation.  Risks, benefits, and alternatives of surgery were discussed including the risk of blood loss; infection; damage to nerves, vessels, tendons, ligaments, bone; failure of surgery; need for additional surgery; complications with wound healing; continued pain; nonunion; malunion; stiffness.  We also discussed the possible need for bone graft and the benefits and risks including the possibility of disease transmission.  She voiced understanding of these risks and elected to proceed.   OPERATIVE COURSE:  After being identified preoperatively by myself, the patient and I agreed upon the procedure and site of procedure.  Surgical site was marked.  The risks, benefits and alternatives of the surgery were reviewed and she wished to proceed.  Surgical consent had been signed.  She was given IV vancomycin as preoperative antibiotic prophylaxis.  She was transferred to the operating room and placed on the operating room table in supine position with the Left upper extremity on an armboard. General and Regional anesthesia was induced by the  anesthesiologist.  The Left upper extremity was prepped and draped in normal sterile orthopedic fashion.  A surgical pause was performed between the surgeons, anesthesia and operating room staff, and all were in agreement as to the patient, procedure and site of procedure.  Tourniquet at the proximal aspect of the extremity was inflated to 250 mmHg after exsanguination of the limb with an Esmarch bandage.  Standard volar Sherilyn Cooter approach was used.  The bipolar electrocautery was used to obtain hemostasis.  The superficial and deep portions of the FCR tendon sheath were incised, and the FCR and FPL were swept ulnarly to protect the palmar cutaneous branch of the median nerve.  The brachioradialis was released at the radial side of the radius.  The pronator quadratus was released and elevated with the periosteal elevator.  The fracture site was identified and cleared of soft tissue interposition and hematoma.  It was reduced under direct visualization.  There did not appear to be intraarticular extension.   An AcuMed volar distal radial locking plate was selected.  It was secured to the bone with the guidepins. The dorsal periosteum was released to allow reduction to the plate. C-arm was used in AP and lateral projections to ensure appropriate reduction and position of the hardware and adjustments made as necessary.  Standard AO drilling and measuring technique was used.  A single screw was placed in the slotted hole in the shaft of the plate.  The distal holes were filled with locking pegs with the exception of the styloid holes, which were filled with locking screws.  The remaining holes in the shaft of the plate were filled with nonlocking screws.  Good purchase was obtained.  C-arm was  used in AP, lateral and oblique projections to ensure appropriate reduction and position of hardware, which was the case.  There was no intra-articular penetration of hardware.  The wound was copiously irrigated with sterile saline.   Pronator quadratus was repaired back over top of the plate using 4-0 Vicryl suture.  Vicryl suture was placed in the subcutaneous tissues in an inverted interrupted fashion and the skin was closed with 4-0 nylon in a horizontal mattress fashion.  There was good pronation and supination of the wrist without crepitance.  The wound was then dressed with sterile Xeroform, 4x4s, and wrapped with a Kerlix bandage.  A volar splint was placed and wrapped with Kerlix and Ace bandage.  Tourniquet was deflated at 53 minutes.  Fingertips were pink with brisk capillary refill after deflation of the tourniquet.  Operative drapes were broken down.  The patient was awoken from anesthesia safely.  She was transferred back to the stretcher and taken to the PACU in stable condition.  I will see her back in the office in one week for postoperative followup.  I will give her a prescription for percocet 5/325 1-2 tabs PO q6 hours prn pain, dispense #30.    Tami Ribas, MD Electronically signed, 10/27/16

## 2016-10-27 NOTE — H&P (Signed)
[name] [name] is an 56 y.o. female.   Chief Complaint: left distal radius fracture HPI: 56 yo rhd female states she fell from standing height 1 week ago injuring left wrist.  Seen in ED where XR revealed distal radius fracture.  Splinted and followed up in office.  She wishes to proceed with operative fixation.  Allergies:  Allergies  Allergen Reactions  . Lipitor [Atorvastatin] Other (See Comments)    Leg pain  . Lyrica [Pregabalin] Other (See Comments)    Leg pain  . Penicillins Other (See Comments)    Unknown Childhood reaction  . Strawberry Extract Hives    Past Medical History:  Diagnosis Date  . Anemia   . Anxiety   . GERD (gastroesophageal reflux disease)   . Headache(784.0)   . History of adenomatous polyp of colon    tubular adenoma 07-31-2015  . Hyperlipidemia   . Iron deficiency   . Left lower quadrant pain    chronic  . RLS (restless legs syndrome)   . Wears glasses   . White coat syndrome without hypertension     Past Surgical History:  Procedure Laterality Date  . ABDOMINAL HYSTERECTOMY    . COLONOSCOPY  last one 07-31-2015  . CYSTOSCOPY  12/12/2011   Procedure: CYSTOSCOPY;  Surgeon: Meriel Pica, MD;  Location: [facility];  Service: Gynecology;  Laterality: N/A;  . DILATION AND CURETTAGE OF UTERUS  1988  . LAPAROSCOPIC ASSISTED VAGINAL HYSTERECTOMY  12/29/2005  . LAPAROSCOPIC SALPINGO OOPHERECTOMY Left 09/21/2015   Procedure: LAPAROSCOPIC SALPINGO OOPHORECTOMY,;  Surgeon: Richarda Overlie, MD;  [facility];  Service: Gynecology;  Laterality: Left;  . PUBOVAGINAL SLING  12/12/2011   Procedure: Leonides Grills;  Surgeon: Meriel Pica, MD;  Location: [facility];  Service: Gynecology;  Laterality: N/A;  Solyx Single Incision Sling    Family History: Family History  Problem Relation Age of Onset  . Heart attack Brother   . Heart attack Father   . Heart Problems Father     Social History:   reports that she has never  smoked. She has never used smokeless tobacco. She reports that she drinks alcohol. She reports that she does not use drugs.  Medications: Medications Prior to Admission  Medication Sig Dispense Refill  . acetaminophen (TYLENOL) 500 MG tablet Take 500 mg by mouth every 6 (six) hours as needed.    Marland Kitchen aspirin 81 MG chewable tablet Chew by mouth daily.    Marland Kitchen b complex vitamins tablet Take 1 tablet by mouth daily.    . calcium-vitamin D (OSCAL WITH D) 500-200 MG-UNIT per tablet Take 1 tablet by mouth daily.    . CRESTOR 5 MG tablet Take 1 tablet by mouth every morning.     . ferrous sulfate 325 (65 FE) MG EC tablet Take 325 mg by mouth daily with breakfast.    . Garlic 1000 MG CAPS Take 1 capsule by mouth daily.    Marland Kitchen ibuprofen (ADVIL,MOTRIN) 200 MG tablet Take 400-600 mg by mouth every 8 (eight) hours as needed. For headache    . LORazepam (ATIVAN) 0.5 MG tablet Take 0.5 mg by mouth every 8 (eight) hours as needed. Pt takes half tablet for anxiety.    Marland Kitchen oxyCODONE-acetaminophen (PERCOCET) 5-325 MG tablet Take 1 tablet by mouth every 4 (four) hours as needed. 20 tablet 0  . pantoprazole (PROTONIX) 40 MG tablet Take 40 mg by mouth 2 (two) times daily.    . Rotigotine (NEUPRO) 1 MG/24HR PT24 Place  1 patch (1 mg total) onto the skin daily. (Patient taking differently: Place 1 mg onto the skin daily. ) 90 patch 4  . [facility] JOHNS WORT PO Take 1 capsule by mouth daily.    . vitamin C (ASCORBIC ACID) 500 MG tablet Take 500 mg by mouth daily.      No results found for this or any previous visit (from the past 48 hour(s)).  No results found.   A comprehensive review of systems was negative except for: Gastrointestinal: positive for constipation Neurological: positive for paresthesia  Blood pressure (!) 158/93, pulse (!) 113, temperature 97.8 F (36.6 C), temperature source Oral, resp. rate 18, height  (1.651 m), weight 80.3 kg (177 lb), SpO2 98 %.  General appearance: alert, cooperative and appears  stated age Head: Normocephalic, without obvious abnormality, atraumatic Neck: supple, symmetrical, trachea midline Resp: clear to auscultation bilaterally Cardio: regular rate and rhythm GI: non-tender Extremities: Intact sensation and capillary refill all digits.  +epl/fpl/io.  No wounds.  Pulses: 2+ and symmetric Skin: Skin color, texture, turgor normal. No rashes or lesions Neurologic: Grossly normal Incision/Wound:none  Assessment/Plan Left distal radius fracture.  Non operative and operative treatment options were discussed with the patient and patient wishes to proceed with operative treatment. Risks, benefits, and alternatives of surgery were discussed and the patient agrees with the plan of care.   Jayven Naill R 10/27/2016, 12:43 PM

## 2016-10-27 NOTE — Progress Notes (Signed)
Assisted Dr. Turk with left, ultrasound guided, supraclavicular block. Side rails up, monitors on throughout procedure. See vital signs in flow sheet. Tolerated Procedure well. 

## 2016-10-27 NOTE — Anesthesia Postprocedure Evaluation (Signed)
Anesthesia Post Note  Patient: [name] [name]  Procedure(s) Performed: OPEN REDUCTION INTERNAL FIXATION (ORIF) DISTAL RADIAL FRACTURE (Left Arm Lower)     Patient location during evaluation: PACU Anesthesia Type: General Level of consciousness: awake and alert Pain management: pain level controlled Vital Signs Assessment: post-procedure vital signs reviewed and stable Respiratory status: spontaneous breathing, nonlabored ventilation and respiratory function stable Cardiovascular status: blood pressure returned to baseline and stable Postop Assessment: no apparent nausea or vomiting Anesthetic complications: no    Last Vitals:  Vitals:   10/27/16 1515 10/27/16 1600  BP: 140/85 (!) 153/89  Pulse: 93 98  Resp: 16 20  Temp:  36.6 C  SpO2: 96% 94%    Last Pain:  Vitals:   10/27/16 1600  TempSrc:   PainSc: 0-No pain                 Cecile Hearing

## 2016-10-27 NOTE — Anesthesia Procedure Notes (Signed)
Procedure Name: LMA Insertion Date/Time: 10/27/2016 1:25 PM Performed by: Burna Cash Pre-anesthesia Checklist: Patient identified, Emergency Drugs available, Suction available and Patient being monitored Patient Re-evaluated:Patient Re-evaluated prior to induction Oxygen Delivery Method: Circle system utilized Preoxygenation: Pre-oxygenation with 100% oxygen Induction Type: IV induction Ventilation: Mask ventilation without difficulty LMA: LMA inserted LMA Size: 4.0 Number of attempts: 1 Airway Equipment and Method: Bite block Placement Confirmation: positive ETCO2 Tube secured with: Tape Dental Injury: Teeth and Oropharynx as per pre-operative assessment

## 2016-10-27 NOTE — Op Note (Signed)
I assisted Surgeon(s) and Role:    * Betha Loa, MD - Primary    Cindee Salt, MD - Assisting on the Procedure(s): OPEN REDUCTION INTERNAL FIXATION (ORIF) DISTAL RADIAL FRACTURE on 10/27/2016.  I provided assistance on this case as follows: setup, approach, debridement of the fracture, reduction, stabilization, fixation with plate and screws, closure and application of the dressings and splint. I was present for the entire case.  Electronically signed by: Nicki Reaper, MD Date: 10/27/2016 Time: 2:34 PM

## 2016-10-28 ENCOUNTER — Encounter (HOSPITAL_BASED_OUTPATIENT_CLINIC_OR_DEPARTMENT_OTHER): Payer: Self-pay | Admitting: Orthopedic Surgery

## 2016-11-05 DIAGNOSIS — N3000 Acute cystitis without hematuria: Secondary | ICD-10-CM | POA: Diagnosis not present

## 2016-11-07 ENCOUNTER — Ambulatory Visit: Payer: BLUE CROSS/BLUE SHIELD | Admitting: Diagnostic Neuroimaging

## 2016-11-07 ENCOUNTER — Telehealth: Payer: Self-pay | Admitting: Diagnostic Neuroimaging

## 2016-11-07 DIAGNOSIS — M25532 Pain in left wrist: Secondary | ICD-10-CM | POA: Diagnosis not present

## 2016-11-07 DIAGNOSIS — S52552D Other extraarticular fracture of lower end of left radius, subsequent encounter for closed fracture with routine healing: Secondary | ICD-10-CM | POA: Diagnosis not present

## 2016-11-07 DIAGNOSIS — M25632 Stiffness of left wrist, not elsewhere classified: Secondary | ICD-10-CM | POA: Diagnosis not present

## 2016-11-07 NOTE — Telephone Encounter (Signed)
LVM informing patient that the only medication Dr Marjory Lies prescribed is Neupro patch. Advised her when he refiled it  in June he gave enough refills to last one year.  Left number for any further questions.

## 2016-11-07 NOTE — Telephone Encounter (Signed)
Patient arrived today for an apt she cancelled back in June. She did resch for December but is worried about getting refills of her medication. Please call [phone]

## 2016-12-05 DIAGNOSIS — S52552D Other extraarticular fracture of lower end of left radius, subsequent encounter for closed fracture with routine healing: Secondary | ICD-10-CM | POA: Diagnosis not present

## 2016-12-14 DIAGNOSIS — M25632 Stiffness of left wrist, not elsewhere classified: Secondary | ICD-10-CM | POA: Diagnosis not present

## 2016-12-14 DIAGNOSIS — S52552D Other extraarticular fracture of lower end of left radius, subsequent encounter for closed fracture with routine healing: Secondary | ICD-10-CM | POA: Diagnosis not present

## 2016-12-19 DIAGNOSIS — M25632 Stiffness of left wrist, not elsewhere classified: Secondary | ICD-10-CM | POA: Diagnosis not present

## 2017-01-18 ENCOUNTER — Ambulatory Visit: Payer: BLUE CROSS/BLUE SHIELD | Admitting: Diagnostic Neuroimaging

## 2017-01-18 DIAGNOSIS — R531 Weakness: Secondary | ICD-10-CM | POA: Diagnosis not present

## 2017-01-18 DIAGNOSIS — M25632 Stiffness of left wrist, not elsewhere classified: Secondary | ICD-10-CM | POA: Diagnosis not present

## 2017-01-18 DIAGNOSIS — S52552D Other extraarticular fracture of lower end of left radius, subsequent encounter for closed fracture with routine healing: Secondary | ICD-10-CM | POA: Diagnosis not present

## 2017-01-19 DIAGNOSIS — Z0000 Encounter for general adult medical examination without abnormal findings: Secondary | ICD-10-CM | POA: Diagnosis not present

## 2017-01-19 DIAGNOSIS — K219 Gastro-esophageal reflux disease without esophagitis: Secondary | ICD-10-CM | POA: Diagnosis not present

## 2017-01-19 DIAGNOSIS — Z23 Encounter for immunization: Secondary | ICD-10-CM | POA: Diagnosis not present

## 2017-01-19 DIAGNOSIS — F419 Anxiety disorder, unspecified: Secondary | ICD-10-CM | POA: Diagnosis not present

## 2017-01-19 DIAGNOSIS — E7800 Pure hypercholesterolemia, unspecified: Secondary | ICD-10-CM | POA: Diagnosis not present

## 2017-01-23 DIAGNOSIS — M25632 Stiffness of left wrist, not elsewhere classified: Secondary | ICD-10-CM | POA: Diagnosis not present

## 2017-01-23 DIAGNOSIS — R531 Weakness: Secondary | ICD-10-CM | POA: Diagnosis not present

## 2017-01-31 DIAGNOSIS — R531 Weakness: Secondary | ICD-10-CM | POA: Diagnosis not present

## 2017-05-16 DIAGNOSIS — Z1231 Encounter for screening mammogram for malignant neoplasm of breast: Secondary | ICD-10-CM | POA: Diagnosis not present

## 2017-05-16 DIAGNOSIS — Z6829 Body mass index (BMI) 29.0-29.9, adult: Secondary | ICD-10-CM | POA: Diagnosis not present

## 2017-05-16 DIAGNOSIS — Z01419 Encounter for gynecological examination (general) (routine) without abnormal findings: Secondary | ICD-10-CM | POA: Diagnosis not present

## 2017-05-25 DIAGNOSIS — F4321 Adjustment disorder with depressed mood: Secondary | ICD-10-CM | POA: Diagnosis not present

## 2017-05-25 DIAGNOSIS — G479 Sleep disorder, unspecified: Secondary | ICD-10-CM | POA: Diagnosis not present

## 2017-05-25 DIAGNOSIS — R238 Other skin changes: Secondary | ICD-10-CM | POA: Diagnosis not present

## 2017-07-28 ENCOUNTER — Ambulatory Visit: Payer: BLUE CROSS/BLUE SHIELD | Admitting: Diagnostic Neuroimaging

## 2017-08-14 ENCOUNTER — Telehealth: Payer: Self-pay | Admitting: *Deleted

## 2017-08-14 ENCOUNTER — Ambulatory Visit: Payer: BLUE CROSS/BLUE SHIELD | Admitting: Diagnostic Neuroimaging

## 2017-08-14 NOTE — Telephone Encounter (Signed)
Pt called and cancelled todays appt less then 24 hours).

## 2017-08-16 ENCOUNTER — Encounter: Payer: Self-pay | Admitting: Diagnostic Neuroimaging

## 2017-08-16 ENCOUNTER — Ambulatory Visit: Payer: BLUE CROSS/BLUE SHIELD | Admitting: Diagnostic Neuroimaging

## 2017-08-16 VITALS — BP 148/89 | HR 90 | Ht 65.0 in | Wt 183.8 lb

## 2017-08-16 DIAGNOSIS — G2581 Restless legs syndrome: Secondary | ICD-10-CM | POA: Diagnosis not present

## 2017-08-16 MED ORDER — ROTIGOTINE 1 MG/24HR TD PT24: 1.0000 mg | MEDICATED_PATCH | Freq: Every day | TRANSDERMAL | 12 refills | Status: DC

## 2017-08-16 NOTE — Progress Notes (Signed)
PATIENT: [name] [name] DOB: 1960-07-06   REASON FOR VISIT: follow up (RLS) HISTORY FROM: patient  HISTORY OF PRESENT ILLNESS:  UPDATE (08/16/17, VRP): Since last visit, doing well with neupro patch. Symptoms are mild. Severity mild. No alleviating or aggravating factors. Tolerating neupro. Husband passed away in 12/06/18and sleep is affected.   UPDATE 07/26/16: Since last visit, has strained right foot in Jan 2018. RLS stable on rotigotine patch. Taking iron supplements.   UPDATE 05/26/15: Since last visit, doing well. Tolerating rotigotine. Taking iron tabs.   UPDATE 05/26/14: Doing well. Has been working out at [employer]; doing much better with her exercise tolerance. She has gained strength and lost 25lbs. Overall RLS sxs better. Doing well on neupro patch.   UPDATE 01/14/14: Since last visit, stable with RLS. Recently tried walking in a 5k, but had to wait for 2 hours in the cold before hand, and then had sig pain during the walk, and she couldn't complete it.   UPDATE 07/10/13: Since last visit, doing well. Satisfied with RLS control (neupro patch, iron replacement). Separately notes more callous/bunion in bilateral feet.  UPDATE 05/10/13: Since last visit patient reduced neupro patch to 1 mg daily. Since then swelling in legs has significantly improved. Patient was tested for iron deficiency and was found to be borderline low. She started iron 325 mg tablet, but had side effects of numbness and tingling and palpitations. She stopped iron tablet and the symptoms resolved. However the iron tablets did seem to improve her restless leg symptoms. Patient is planning to modify her diet to increase iron levels as well as try a low-dose iron supplement.  UPDATE 04/08/13 (VP): Since last visit, tried neupro 2mg  patch with some benefit in RLS. 2-3 weeks ago, stopped her iron supplement, because she was unsure about duration of therapy. Last Sunday, developed mild, gradual swelling in feet, ankles,  fingers. No CP, SOB. Also with intermittent pain in legs with exertion/walking, and continue RLS symptoms at rest.   UPDATE 12/24/12 (LL):  Patient states Neupro 1 mg is working. Since the patch is working, she has been more active. She is very happy she is able to be more active. She has noticed her legs are hurting more at night with increased activity. Requesting to know if Neupro patch dosage should be increased.   PRIOR HPI 10/24/12 (VP):  57 year old right-handed female here for evaluation of leg pain. In patient reports pain in her legs, throughout, aching and sore feeling, which is worse when she is walking. Spent or she sits down. However later in the evening she feels restless and has the urge to move her legs. In the morning when she wakes up her legs are quite stiff. Symptoms are going on for past 6-8 months. Patient tried Lyrica 50 mg at bedtime, tried 100 mg dose but could not tolerate it. Patient was diagnosed with low iron levels and is on replacement for past one month.    REVIEW OF SYSTEMS: Full 14 system review of systems performed and negative except: joint pain restless legs.    ALLERGIES: Allergies  Allergen Reactions  . Lipitor [Atorvastatin] Other (See Comments)    Leg pain  . Lyrica [Pregabalin] Other (See Comments)    Leg pain  . Penicillins Other (See Comments)    Unknown Childhood reaction  . Strawberry Extract Hives    HOME MEDICATIONS: Outpatient Medications Prior to Visit  Medication Sig Dispense Refill  . b complex vitamins tablet Take 1 tablet  by mouth daily.    . calcium-vitamin D (OSCAL WITH D) 500-200 MG-UNIT per tablet Take 1 tablet by mouth daily.    . CRESTOR 5 MG tablet Take 1 tablet by mouth every morning.     Marland Kitchen. estradiol (VIVELLE-DOT) 0.05 MG/24HR patch APP 1 PATCH TO SKIN TWICE WEEKLY UTD  3  . ferrous sulfate 325 (65 FE) MG EC tablet Take 325 mg by mouth daily with breakfast.    . Garlic 1000 MG CAPS Take 1 capsule by mouth daily.    Marland Kitchen.  ibuprofen (ADVIL,MOTRIN) 200 MG tablet Take 400-600 mg by mouth every 8 (eight) hours as needed. For headache    . LORazepam (ATIVAN) 0.5 MG tablet Take 0.5 mg by mouth every 8 (eight) hours as needed. Pt takes half tablet for anxiety.    . pantoprazole (PROTONIX) 40 MG tablet Take 40 mg by mouth 2 (two) times daily.    . Rotigotine (NEUPRO) 1 MG/24HR PT24 Place 1 patch (1 mg total) onto the skin daily. (Patient taking differently: Place 1 mg onto the skin daily. ) 90 patch 4  . [place] JOHNS WORT PO Take 1 capsule by mouth daily.    . vitamin C (ASCORBIC ACID) 500 MG tablet Take 500 mg by mouth daily.    Marland Kitchen. aspirin 81 MG chewable tablet Chew by mouth daily.    Marland Kitchen. oxyCODONE-acetaminophen (PERCOCET) 5-325 MG tablet Take 1 tablet by mouth every 4 (four) hours as needed. 20 tablet 0  . oxyCODONE-acetaminophen (PERCOCET) 5-325 MG tablet 1-2 tabs PO q6 hours prn pain 30 tablet 0   No facility-administered medications prior to visit.     PAST MEDICAL HISTORY: Past Medical History:  Diagnosis Date  . Anemia   . Anxiety   . GERD (gastroesophageal reflux disease)   . Headache(784.0)   . History of adenomatous polyp of colon    tubular adenoma 07-31-2015  . Hyperlipidemia   . Iron deficiency   . Left lower quadrant pain    chronic  . RLS (restless legs syndrome)   . Wears glasses   . White coat syndrome without hypertension     PAST SURGICAL HISTORY: Past Surgical History:  Procedure Laterality Date  . ABDOMINAL HYSTERECTOMY    . COLONOSCOPY  last one 07-31-2015  . CYSTOSCOPY  12/12/2011   Procedure: CYSTOSCOPY;  Surgeon: Meriel Picaichard M Holland, MD;  Location: [facility];  Service: Gynecology;  Laterality: N/A;  . DILATION AND CURETTAGE OF UTERUS  1988  . LAPAROSCOPIC ASSISTED VAGINAL HYSTERECTOMY  12/29/2005  . LAPAROSCOPIC SALPINGO OOPHERECTOMY Left 09/21/2015   Procedure: LAPAROSCOPIC SALPINGO OOPHORECTOMY,;  Surgeon: Richarda Overlieichard Holland, MD;  [facility] [place];  Service: Gynecology;   Laterality: Left;  . OPEN REDUCTION INTERNAL FIXATION (ORIF) DISTAL RADIAL FRACTURE Left 10/27/2016   Procedure: OPEN REDUCTION INTERNAL FIXATION (ORIF) DISTAL RADIAL FRACTURE;  Surgeon: Betha LoaKuzma, Kevin, MD;  Location: [facility];  Service: Orthopedics;  Laterality: Left;  . PUBOVAGINAL SLING  12/12/2011   Procedure: Leonides GrillsPUBO-VAGINAL SLING;  Surgeon: Meriel Picaichard M Holland, MD;  Location: [facility];  Service: Gynecology;  Laterality: N/A;  Solyx Single Incision Sling    FAMILY HISTORY: Family History  Problem Relation Age of Onset  . Heart attack Brother   . Heart attack Father   . Heart Problems Father     SOCIAL HISTORY: Social History   Socioeconomic History  . Marital status: Married    Spouse name: Jena GaussHugh  . Number of children: 2  . Years of education: College  .  Highest education level: Not on file  Occupational History  . Occupation: Scientist, research (medical): H[employer]    Comment: retired from [employer]  . Financial resource strain: Not on file  . Food insecurity:    Worry: Not on file    Inability: Not on file  . Transportation needs:    Medical: Not on file    Non-medical: Not on file  Tobacco Use  . Smoking status: Never Smoker  . Smokeless tobacco: Never Used  Substance and Sexual Activity  . Alcohol use: Yes    Comment: social  . Drug use: No  . Sexual activity: Not on file  Lifestyle  . Physical activity:    Days per week: Not on file    Minutes per session: Not on file  . Stress: Not on file  Relationships  . Social connections:    Talks on phone: Not on file    Gets together: Not on file    Attends religious service: Not on file    Active member of club or organization: Not on file    Attends meetings of clubs or organizations: Not on file    Relationship status: Not on file  . Intimate partner violence:    Fear of current or ex partner: Not on file    Emotionally abused: Not on file    Physically abused: Not on file    Forced  sexual activity: Not on file  Other Topics Concern  . Not on file  Social History Narrative   Patient lives at home with family.   Caffeine Use: 4 cups of tea daily     PHYSICAL EXAM  Vitals:   08/16/17 1313  BP: (!) 148/89  Pulse: 90  Weight: 183 lb 12.8 oz (83.4 kg)  Height: 5\' 5"  (1.651 m)   Body mass index is 30.59 kg/m.  GENERAL EXAM: Patient is in no distress; well developed, nourished and groomed; neck is supple  CARDIOVASCULAR: Regular rate and rhythm, no murmurs, no carotid bruits  NEUROLOGIC: MENTAL STATUS: awake, alert, language fluent, comprehension intact, naming intact, fund of knowledge appropriate CRANIAL NERVE:  pupils equal and reactive to light, visual fields full to confrontation, extraocular muscles intact, no nystagmus, facial sensation and strength symmetric, hearing intact, palate elevates symmetrically, uvula midline, shoulder shrug symmetric, tongue midline. MOTOR: normal bulk and tone, full strength in the BUE, BLE SENSORY: normal and symmetric to light touch, temperature, vibration COORDINATION: finger-nose-finger, fine finger movements normal REFLEXES: deep tendon reflexes present and symmetric GAIT/STATION: narrow based gait    DIAGNOSTIC DATA (LABS, IMAGING, TESTING)  - None to review    ASSESSMENT AND PLAN  57 y.o. female here with restless leg syndrome and low iron levels. Neuro exam normal. Doing well on neupro 1mg  patch and iron replacement.  Dx:  Restless leg syndrome     PLAN:  RESTLESS LEG SYNDROME (stable) - continue neupro patch 1mg  daily - continue low iron replacement treatments - follow up iron levels with PCP  HYPERTENSION (slightly worse; white coat vs essential) - monitor BP at home; follow up with PCP - reviewed nutrition (eating out a lot); will try to improve  Meds ordered this encounter  Medications  . Rotigotine (NEUPRO) 1 MG/24HR PT24    Sig: Place 1 patch (1 mg total) onto the skin daily.     Dispense:  30 patch    Refill:  12   Return in about 1 year (around 08/17/2018).  Suanne Marker, MD 08/16/2017, 2:02 PM Certified in Neurology, Neurophysiology and Neuroimaging  [facility] [address] [place], [place] [address] [phone]

## 2017-08-28 DIAGNOSIS — R030 Elevated blood-pressure reading, without diagnosis of hypertension: Secondary | ICD-10-CM | POA: Diagnosis not present

## 2017-09-04 ENCOUNTER — Telehealth: Payer: Self-pay | Admitting: *Deleted

## 2017-09-04 NOTE — Telephone Encounter (Signed)
Started PA for Neupro patch (generic) on CMM, key [id number]. First prescribed 10/24/2012. Tried/failed Lyrica- caused leg pain.

## 2017-09-07 NOTE — Telephone Encounter (Signed)
Appeal letter, [facility]

## 2017-09-07 NOTE — Telephone Encounter (Signed)
Appeal letter written, reviewed and signed by Dr. Marjory LiesPenumalli.

## 2017-09-12 NOTE — Telephone Encounter (Signed)
I reached out to the appeals department with [facility] and spoke with Shanell S. She stated the appeal for Neupro patch is still under review and a determination should be made by 09/14/2017.  MB RN

## 2017-09-14 MED ORDER — ROPINIROLE HCL 0.25 MG PO TABS: 0.2500 mg | ORAL_TABLET | Freq: Two times a day (BID) | ORAL | 12 refills | Status: DC

## 2017-09-14 NOTE — Telephone Encounter (Signed)
Start ropinirole 0.25mg  twice a day.   Stop neupro patch.    Meds ordered this encounter  Medications  . rOPINIRole (REQUIP) 0.25 MG tablet    Sig: Take 1 tablet (0.25 mg total) by mouth 2 (two) times daily.    Dispense:  60 tablet    Refill:  12   Suanne MarkerVIKRAM R. PENUMALLI, MD 09/14/2017, 5:11 PM Certified in Neurology, Neurophysiology and Neuroimaging  [facility] [address], [address] [place], [place] [id number] [phone]

## 2017-09-14 NOTE — Telephone Encounter (Addendum)
Pt called [employer] and was advised this medication will not be covered. Advised to try Covermymeds or alternate medications. Please call to advise

## 2017-09-14 NOTE — Telephone Encounter (Signed)
LVM informing patient that even after appeal Neupro patches are denied by her insurance. Requested she call back to let this RN know how many patches she has left. Also advised her that there are two medications her insurance wants her to try, re: ropinirole and pramipexole. Left number for her to call back.

## 2017-09-14 NOTE — Addendum Note (Signed)
Addended by: Joycelyn SchmidPENUMALLI, Indy Prestwood R on: 09/14/2017 05:11 PM   Modules accepted: Orders

## 2017-09-14 NOTE — Telephone Encounter (Signed)
Pt called to check on status of appeal.

## 2017-09-14 NOTE — Telephone Encounter (Signed)
Spoke with patient and informed her Dr Marjory LiesPenumalli prescribed Ropinirole. Advised she stop patches when she begins PO medication. Advised her if the dose of ropinirole isn't helpful he can increase the dose. She verbalized understanding, appreciation.

## 2017-09-14 NOTE — Telephone Encounter (Addendum)
Patient returned call stating she only has a few Neupro patches left. She had spoken with [employer] today and was told it was denied. This RN asked if she had tried ropinirole or pramipexole which are on [employer] formulary; she has not, has been on patch since Oct 2014. She stated it had been approved until her husband passed away, and she changed to [facility] [facility]. She stated the patch works very well, but she will try one of the other medications. This RN advised will let Dr Marjory LiesPenumalli know to prescribe one of the other medications. She verbalized understanding, appreciation.

## 2017-09-20 DIAGNOSIS — M25559 Pain in unspecified hip: Secondary | ICD-10-CM | POA: Diagnosis not present

## 2017-10-05 DIAGNOSIS — Z23 Encounter for immunization: Secondary | ICD-10-CM | POA: Diagnosis not present

## 2017-10-24 DIAGNOSIS — M25559 Pain in unspecified hip: Secondary | ICD-10-CM | POA: Diagnosis not present

## 2017-10-25 DIAGNOSIS — Z13820 Encounter for screening for osteoporosis: Secondary | ICD-10-CM | POA: Diagnosis not present

## 2017-10-26 DIAGNOSIS — M25551 Pain in right hip: Secondary | ICD-10-CM | POA: Diagnosis not present

## 2017-11-08 DIAGNOSIS — R1031 Right lower quadrant pain: Secondary | ICD-10-CM | POA: Diagnosis not present

## 2017-12-15 DIAGNOSIS — M9902 Segmental and somatic dysfunction of thoracic region: Secondary | ICD-10-CM | POA: Diagnosis not present

## 2017-12-15 DIAGNOSIS — M9903 Segmental and somatic dysfunction of lumbar region: Secondary | ICD-10-CM | POA: Diagnosis not present

## 2017-12-15 DIAGNOSIS — M9905 Segmental and somatic dysfunction of pelvic region: Secondary | ICD-10-CM | POA: Diagnosis not present

## 2017-12-15 DIAGNOSIS — M9901 Segmental and somatic dysfunction of cervical region: Secondary | ICD-10-CM | POA: Diagnosis not present

## 2017-12-18 DIAGNOSIS — M9902 Segmental and somatic dysfunction of thoracic region: Secondary | ICD-10-CM | POA: Diagnosis not present

## 2017-12-18 DIAGNOSIS — R293 Abnormal posture: Secondary | ICD-10-CM | POA: Diagnosis not present

## 2017-12-18 DIAGNOSIS — M9901 Segmental and somatic dysfunction of cervical region: Secondary | ICD-10-CM | POA: Diagnosis not present

## 2017-12-18 DIAGNOSIS — M40292 Other kyphosis, cervical region: Secondary | ICD-10-CM | POA: Diagnosis not present

## 2017-12-19 DIAGNOSIS — M9901 Segmental and somatic dysfunction of cervical region: Secondary | ICD-10-CM | POA: Diagnosis not present

## 2017-12-19 DIAGNOSIS — M9902 Segmental and somatic dysfunction of thoracic region: Secondary | ICD-10-CM | POA: Diagnosis not present

## 2017-12-19 DIAGNOSIS — R293 Abnormal posture: Secondary | ICD-10-CM | POA: Diagnosis not present

## 2017-12-19 DIAGNOSIS — M40292 Other kyphosis, cervical region: Secondary | ICD-10-CM | POA: Diagnosis not present

## 2017-12-20 DIAGNOSIS — R293 Abnormal posture: Secondary | ICD-10-CM | POA: Diagnosis not present

## 2017-12-20 DIAGNOSIS — M9902 Segmental and somatic dysfunction of thoracic region: Secondary | ICD-10-CM | POA: Diagnosis not present

## 2017-12-20 DIAGNOSIS — M9901 Segmental and somatic dysfunction of cervical region: Secondary | ICD-10-CM | POA: Diagnosis not present

## 2017-12-20 DIAGNOSIS — M40292 Other kyphosis, cervical region: Secondary | ICD-10-CM | POA: Diagnosis not present

## 2017-12-25 DIAGNOSIS — R293 Abnormal posture: Secondary | ICD-10-CM | POA: Diagnosis not present

## 2017-12-25 DIAGNOSIS — M9901 Segmental and somatic dysfunction of cervical region: Secondary | ICD-10-CM | POA: Diagnosis not present

## 2017-12-25 DIAGNOSIS — M9902 Segmental and somatic dysfunction of thoracic region: Secondary | ICD-10-CM | POA: Diagnosis not present

## 2017-12-25 DIAGNOSIS — M40292 Other kyphosis, cervical region: Secondary | ICD-10-CM | POA: Diagnosis not present

## 2017-12-27 DIAGNOSIS — R293 Abnormal posture: Secondary | ICD-10-CM | POA: Diagnosis not present

## 2017-12-27 DIAGNOSIS — M40292 Other kyphosis, cervical region: Secondary | ICD-10-CM | POA: Diagnosis not present

## 2017-12-27 DIAGNOSIS — M9901 Segmental and somatic dysfunction of cervical region: Secondary | ICD-10-CM | POA: Diagnosis not present

## 2017-12-27 DIAGNOSIS — M9902 Segmental and somatic dysfunction of thoracic region: Secondary | ICD-10-CM | POA: Diagnosis not present

## 2017-12-29 DIAGNOSIS — L821 Other seborrheic keratosis: Secondary | ICD-10-CM | POA: Diagnosis not present

## 2017-12-29 DIAGNOSIS — M40292 Other kyphosis, cervical region: Secondary | ICD-10-CM | POA: Diagnosis not present

## 2017-12-29 DIAGNOSIS — M9902 Segmental and somatic dysfunction of thoracic region: Secondary | ICD-10-CM | POA: Diagnosis not present

## 2017-12-29 DIAGNOSIS — M9901 Segmental and somatic dysfunction of cervical region: Secondary | ICD-10-CM | POA: Diagnosis not present

## 2017-12-29 DIAGNOSIS — R293 Abnormal posture: Secondary | ICD-10-CM | POA: Diagnosis not present

## 2018-01-01 ENCOUNTER — Telehealth: Payer: Self-pay | Admitting: Diagnostic Neuroimaging

## 2018-01-01 DIAGNOSIS — M9902 Segmental and somatic dysfunction of thoracic region: Secondary | ICD-10-CM | POA: Diagnosis not present

## 2018-01-01 DIAGNOSIS — M9901 Segmental and somatic dysfunction of cervical region: Secondary | ICD-10-CM | POA: Diagnosis not present

## 2018-01-01 DIAGNOSIS — R293 Abnormal posture: Secondary | ICD-10-CM | POA: Diagnosis not present

## 2018-01-01 DIAGNOSIS — M40292 Other kyphosis, cervical region: Secondary | ICD-10-CM | POA: Diagnosis not present

## 2018-01-01 NOTE — Telephone Encounter (Signed)
Pt states she is having pain in right arm and is questioning if this could be side effect to the medication  rOPINIRole (REQUIP) 0.25 MG tablet.  Pt states medication otherwise is helping her very much

## 2018-01-01 NOTE — Telephone Encounter (Signed)
Spoke with patient who stated in the past few weeks she noticed her right forearm  is aching. She began seeing a Chiropractor for hip pain. She stated today he "worked on her right arm". She exercises 2-3 x weekly and denies beginning any new exercise regimes. She does report doing yard work this fall since her husband passed away last year.  This RN advised she can cut back to Requip once at night time x 1 week and see if the achyness improves. The patient was agreeable to this plan. This RN asked her to call back and update how she is doing. Patient verbalized understanding, appreciation.

## 2018-01-03 DIAGNOSIS — R293 Abnormal posture: Secondary | ICD-10-CM | POA: Diagnosis not present

## 2018-01-03 DIAGNOSIS — M9901 Segmental and somatic dysfunction of cervical region: Secondary | ICD-10-CM | POA: Diagnosis not present

## 2018-01-03 DIAGNOSIS — M40292 Other kyphosis, cervical region: Secondary | ICD-10-CM | POA: Diagnosis not present

## 2018-01-03 DIAGNOSIS — M9902 Segmental and somatic dysfunction of thoracic region: Secondary | ICD-10-CM | POA: Diagnosis not present

## 2018-01-04 DIAGNOSIS — R293 Abnormal posture: Secondary | ICD-10-CM | POA: Diagnosis not present

## 2018-01-04 DIAGNOSIS — M9901 Segmental and somatic dysfunction of cervical region: Secondary | ICD-10-CM | POA: Diagnosis not present

## 2018-01-04 DIAGNOSIS — M40292 Other kyphosis, cervical region: Secondary | ICD-10-CM | POA: Diagnosis not present

## 2018-01-04 DIAGNOSIS — M9902 Segmental and somatic dysfunction of thoracic region: Secondary | ICD-10-CM | POA: Diagnosis not present

## 2018-01-05 DIAGNOSIS — M9902 Segmental and somatic dysfunction of thoracic region: Secondary | ICD-10-CM | POA: Diagnosis not present

## 2018-01-05 DIAGNOSIS — M40292 Other kyphosis, cervical region: Secondary | ICD-10-CM | POA: Diagnosis not present

## 2018-01-05 DIAGNOSIS — M9901 Segmental and somatic dysfunction of cervical region: Secondary | ICD-10-CM | POA: Diagnosis not present

## 2018-01-05 DIAGNOSIS — R293 Abnormal posture: Secondary | ICD-10-CM | POA: Diagnosis not present

## 2018-01-10 NOTE — Telephone Encounter (Signed)
Attempted to reach patient. Received recording, "your call cannot be completed at this time".

## 2018-01-10 NOTE — Telephone Encounter (Signed)
Not likely related to requip. Consider PCP or sport medicine follow up. -VRP

## 2018-01-11 NOTE — Telephone Encounter (Signed)
LVM informing patient of Dr Visteon CorporationPenumalli's message. This RN stated she hopes the patient feels better, left office number.

## 2018-01-29 DIAGNOSIS — M9902 Segmental and somatic dysfunction of thoracic region: Secondary | ICD-10-CM | POA: Diagnosis not present

## 2018-01-29 DIAGNOSIS — M40292 Other kyphosis, cervical region: Secondary | ICD-10-CM | POA: Diagnosis not present

## 2018-01-29 DIAGNOSIS — R293 Abnormal posture: Secondary | ICD-10-CM | POA: Diagnosis not present

## 2018-01-29 DIAGNOSIS — M9901 Segmental and somatic dysfunction of cervical region: Secondary | ICD-10-CM | POA: Diagnosis not present

## 2018-01-31 DIAGNOSIS — M9902 Segmental and somatic dysfunction of thoracic region: Secondary | ICD-10-CM | POA: Diagnosis not present

## 2018-01-31 DIAGNOSIS — M9901 Segmental and somatic dysfunction of cervical region: Secondary | ICD-10-CM | POA: Diagnosis not present

## 2018-01-31 DIAGNOSIS — M25529 Pain in unspecified elbow: Secondary | ICD-10-CM | POA: Diagnosis not present

## 2018-01-31 DIAGNOSIS — R293 Abnormal posture: Secondary | ICD-10-CM | POA: Diagnosis not present

## 2018-01-31 DIAGNOSIS — M40292 Other kyphosis, cervical region: Secondary | ICD-10-CM | POA: Diagnosis not present

## 2018-02-02 DIAGNOSIS — R293 Abnormal posture: Secondary | ICD-10-CM | POA: Diagnosis not present

## 2018-02-02 DIAGNOSIS — M9901 Segmental and somatic dysfunction of cervical region: Secondary | ICD-10-CM | POA: Diagnosis not present

## 2018-02-02 DIAGNOSIS — M9902 Segmental and somatic dysfunction of thoracic region: Secondary | ICD-10-CM | POA: Diagnosis not present

## 2018-02-02 DIAGNOSIS — M40292 Other kyphosis, cervical region: Secondary | ICD-10-CM | POA: Diagnosis not present

## 2018-02-07 DIAGNOSIS — I10 Essential (primary) hypertension: Secondary | ICD-10-CM | POA: Diagnosis not present

## 2018-02-07 DIAGNOSIS — Z0000 Encounter for general adult medical examination without abnormal findings: Secondary | ICD-10-CM | POA: Diagnosis not present

## 2018-02-07 DIAGNOSIS — F4321 Adjustment disorder with depressed mood: Secondary | ICD-10-CM | POA: Diagnosis not present

## 2018-02-07 DIAGNOSIS — K219 Gastro-esophageal reflux disease without esophagitis: Secondary | ICD-10-CM | POA: Diagnosis not present

## 2018-02-07 DIAGNOSIS — E7800 Pure hypercholesterolemia, unspecified: Secondary | ICD-10-CM | POA: Diagnosis not present

## 2018-02-08 DIAGNOSIS — E559 Vitamin D deficiency, unspecified: Secondary | ICD-10-CM | POA: Diagnosis not present

## 2018-02-08 DIAGNOSIS — Z0000 Encounter for general adult medical examination without abnormal findings: Secondary | ICD-10-CM | POA: Diagnosis not present

## 2018-02-15 NOTE — Telephone Encounter (Signed)
Pt called still with continued R arm pain and R shin pain.  She is taking the requip 0.25mg  po daily (not bid), but still with problem, but states one tablet is effective with requip.  She did go to sports med PT which she stated did not help her, I believe was for her hip pain, but chiropractor did help her hip pain.  She does a lot of exercise ([employer]).  She is asking about a possibility for another medication to try.  Unsure if exercise is causing or requip (which she has noted that can have those SE).  I told her that per Dr. Marjory LiesPenumalli from 01-11-18 note that requip not likely to be the cause to see pcp or sports med).  I told her to decrease her exercise/ stop for 1-2 wks and see if anything changes.  If not to call us back and will ask to see if Dr. Marjory LiesPenumalli will try something else.  She will call her insurance and see if since new yr to change back to neupro patch. She will let us know who she does.

## 2018-03-13 DIAGNOSIS — F4321 Adjustment disorder with depressed mood: Secondary | ICD-10-CM | POA: Diagnosis not present

## 2018-03-13 DIAGNOSIS — M7711 Lateral epicondylitis, right elbow: Secondary | ICD-10-CM | POA: Diagnosis not present

## 2018-03-22 DIAGNOSIS — M25521 Pain in right elbow: Secondary | ICD-10-CM | POA: Diagnosis not present

## 2018-03-22 DIAGNOSIS — M7711 Lateral epicondylitis, right elbow: Secondary | ICD-10-CM | POA: Diagnosis not present

## 2018-05-28 DIAGNOSIS — Z6830 Body mass index (BMI) 30.0-30.9, adult: Secondary | ICD-10-CM | POA: Diagnosis not present

## 2018-05-28 DIAGNOSIS — Z01419 Encounter for gynecological examination (general) (routine) without abnormal findings: Secondary | ICD-10-CM | POA: Diagnosis not present

## 2018-05-28 DIAGNOSIS — Z1231 Encounter for screening mammogram for malignant neoplasm of breast: Secondary | ICD-10-CM | POA: Diagnosis not present

## 2018-06-05 DIAGNOSIS — S0990XA Unspecified injury of head, initial encounter: Secondary | ICD-10-CM | POA: Diagnosis not present

## 2018-06-14 ENCOUNTER — Telehealth: Payer: Self-pay | Admitting: *Deleted

## 2018-06-14 NOTE — Telephone Encounter (Signed)
Spoke with patient and updated EMR. 

## 2018-06-19 ENCOUNTER — Encounter: Payer: Self-pay | Admitting: Diagnostic Neuroimaging

## 2018-06-19 ENCOUNTER — Other Ambulatory Visit: Payer: Self-pay

## 2018-06-19 ENCOUNTER — Ambulatory Visit (INDEPENDENT_AMBULATORY_CARE_PROVIDER_SITE_OTHER): Payer: BLUE CROSS/BLUE SHIELD | Admitting: Diagnostic Neuroimaging

## 2018-06-19 DIAGNOSIS — G2581 Restless legs syndrome: Secondary | ICD-10-CM | POA: Diagnosis not present

## 2018-06-19 MED ORDER — ROPINIROLE HCL 0.25 MG PO TABS: 0.2500 mg | ORAL_TABLET | Freq: Every day | ORAL | 4 refills | Status: DC

## 2018-06-19 NOTE — Progress Notes (Signed)
    Virtual Visit via Video Note  I connected with [name] [name] on 06/19/18 at  1:00 PM EDT by a video enabled telemedicine application and verified that I am speaking with the correct person using two identifiers.   I discussed the limitations of evaluation and management by telemedicine and the availability of in person appointments. The patient expressed understanding and agreed to proceed.  Patient is at their home. I am at the office.    History of Present Illness:  UPDATE (06/19/18, VRP): Since last visit, doing well. Symptoms are stable. Severity is mild. No alleviating or aggravating factors. Tolerating ropinirole now (neupro not covered by insurance anymore).      Observations/Objective:   VIDEO EXAM  GENERAL EXAM/CONSTITUTIONAL:  Vitals: There were no vitals filed for this visit.  There is no height or weight on file to calculate BMI. Wt Readings from Last 3 Encounters:  08/16/17 183 lb 12.8 oz (83.4 kg)  10/27/16 177 lb (80.3 kg)  07/26/16 177 lb (80.3 kg)     Patient is in no distress; well developed, nourished and groomed; neck is supple   NEUROLOGIC: MENTAL STATUS:  No flowsheet data found.  awake, alert, oriented to person, place and time  recent and remote memory intact  normal attention and concentration  language fluent, comprehension intact, naming intact  fund of knowledge appropriate  CRANIAL NERVE:   2nd, 3rd, 4th, 6th - visual fields full to confrontation, extraocular muscles intact, no nystagmus  5th - facial sensation symmetric  7th - facial strength symmetric  8th - hearing intact  11th - shoulder shrug symmetric  12th - tongue protrusion midline  MOTOR:   NO TREMOR; NO DRIFT IN BUE  SENSORY:   normal and symmetric to light touch  COORDINATION:   fine finger movements normal    Assessment and Plan:  Dx:  1. Restless leg syndrome     RESTLESS LEG SYNDROME (stable) - continue ropinirole 0.25mg  daily -  continue low iron replacement treatments - follow up iron levels with PCP  Meds ordered this encounter  Medications  . rOPINIRole (REQUIP) 0.25 MG tablet    Sig: Take 1 tablet (0.25 mg total) by mouth daily.    Dispense:  90 tablet    Refill:  4     Follow Up Instructions:  - Return in about 1 year (around 06/19/2019).    I discussed the assessment and treatment plan with the patient. The patient was provided an opportunity to ask questions and all were answered. The patient agreed with the plan and demonstrated an understanding of the instructions.   The patient was advised to call back or seek an in-person evaluation if the symptoms worsen or if the condition fails to improve as anticipated.  I provided 15 minutes of non-face-to-face time during this encounter.   Suanne Marker, MD 06/19/2018, 1:09 PM Certified in Neurology, Neurophysiology and Neuroimaging  [facility] [address], [address] [place], [place] [address] [phone]

## 2018-08-13 DIAGNOSIS — F333 Major depressive disorder, recurrent, severe with psychotic symptoms: Secondary | ICD-10-CM | POA: Diagnosis not present

## 2018-08-14 DIAGNOSIS — L281 Prurigo nodularis: Secondary | ICD-10-CM | POA: Diagnosis not present

## 2018-08-14 DIAGNOSIS — L814 Other melanin hyperpigmentation: Secondary | ICD-10-CM | POA: Diagnosis not present

## 2018-08-14 DIAGNOSIS — D485 Neoplasm of uncertain behavior of skin: Secondary | ICD-10-CM | POA: Diagnosis not present

## 2018-08-14 DIAGNOSIS — D1801 Hemangioma of skin and subcutaneous tissue: Secondary | ICD-10-CM | POA: Diagnosis not present

## 2018-08-14 DIAGNOSIS — D225 Melanocytic nevi of trunk: Secondary | ICD-10-CM | POA: Diagnosis not present

## 2018-08-21 ENCOUNTER — Ambulatory Visit: Payer: BLUE CROSS/BLUE SHIELD | Admitting: Diagnostic Neuroimaging

## 2018-09-03 DIAGNOSIS — F419 Anxiety disorder, unspecified: Secondary | ICD-10-CM | POA: Diagnosis not present

## 2018-09-03 DIAGNOSIS — F331 Major depressive disorder, recurrent, moderate: Secondary | ICD-10-CM | POA: Diagnosis not present

## 2018-10-15 DIAGNOSIS — I10 Essential (primary) hypertension: Secondary | ICD-10-CM | POA: Diagnosis not present

## 2018-10-15 DIAGNOSIS — E7800 Pure hypercholesterolemia, unspecified: Secondary | ICD-10-CM | POA: Diagnosis not present

## 2018-10-15 DIAGNOSIS — F419 Anxiety disorder, unspecified: Secondary | ICD-10-CM | POA: Diagnosis not present

## 2018-10-15 DIAGNOSIS — F331 Major depressive disorder, recurrent, moderate: Secondary | ICD-10-CM | POA: Diagnosis not present

## 2018-10-17 DIAGNOSIS — Z6831 Body mass index (BMI) 31.0-31.9, adult: Secondary | ICD-10-CM | POA: Diagnosis not present

## 2018-10-17 DIAGNOSIS — I10 Essential (primary) hypertension: Secondary | ICD-10-CM | POA: Diagnosis not present

## 2018-10-17 DIAGNOSIS — E7800 Pure hypercholesterolemia, unspecified: Secondary | ICD-10-CM | POA: Diagnosis not present

## 2018-12-24 DIAGNOSIS — L821 Other seborrheic keratosis: Secondary | ICD-10-CM | POA: Diagnosis not present

## 2018-12-24 DIAGNOSIS — L814 Other melanin hyperpigmentation: Secondary | ICD-10-CM | POA: Diagnosis not present

## 2018-12-24 DIAGNOSIS — L281 Prurigo nodularis: Secondary | ICD-10-CM | POA: Diagnosis not present

## 2018-12-24 DIAGNOSIS — D225 Melanocytic nevi of trunk: Secondary | ICD-10-CM | POA: Diagnosis not present

## 2019-02-25 ENCOUNTER — Other Ambulatory Visit: Payer: Self-pay | Admitting: Obstetrics and Gynecology

## 2019-02-25 DIAGNOSIS — N644 Mastodynia: Secondary | ICD-10-CM

## 2019-02-27 ENCOUNTER — Ambulatory Visit: Payer: BLUE CROSS/BLUE SHIELD

## 2019-02-27 ENCOUNTER — Other Ambulatory Visit: Payer: Self-pay

## 2019-02-27 ENCOUNTER — Ambulatory Visit
Admission: RE | Admit: 2019-02-27 | Discharge: 2019-02-27 | Disposition: A | Discharge status: Home / Self Care | Payer: 59 | Source: Ambulatory Visit | Attending: Obstetrics and Gynecology | Admitting: Obstetrics and Gynecology

## 2019-02-27 DIAGNOSIS — N644 Mastodynia: Secondary | ICD-10-CM

## 2019-03-06 ENCOUNTER — Other Ambulatory Visit: Payer: BLUE CROSS/BLUE SHIELD

## 2019-05-29 ENCOUNTER — Encounter: Payer: Self-pay | Admitting: Cardiology

## 2019-05-29 ENCOUNTER — Encounter (INDEPENDENT_AMBULATORY_CARE_PROVIDER_SITE_OTHER): Payer: Self-pay

## 2019-05-29 ENCOUNTER — Ambulatory Visit (INDEPENDENT_AMBULATORY_CARE_PROVIDER_SITE_OTHER): Payer: 59 | Admitting: Cardiology

## 2019-05-29 ENCOUNTER — Other Ambulatory Visit: Payer: Self-pay

## 2019-05-29 VITALS — BP 146/86 | HR 77 | Ht 65.0 in | Wt 185.8 lb

## 2019-05-29 DIAGNOSIS — Z8249 Family history of ischemic heart disease and other diseases of the circulatory system: Secondary | ICD-10-CM

## 2019-05-29 DIAGNOSIS — R5383 Other fatigue: Secondary | ICD-10-CM

## 2019-05-29 DIAGNOSIS — I10 Essential (primary) hypertension: Secondary | ICD-10-CM

## 2019-05-29 DIAGNOSIS — R072 Precordial pain: Secondary | ICD-10-CM | POA: Diagnosis not present

## 2019-05-29 DIAGNOSIS — E785 Hyperlipidemia, unspecified: Secondary | ICD-10-CM

## 2019-05-29 MED ORDER — ASPIRIN EC 81 MG PO TBEC: 81.0000 mg | DELAYED_RELEASE_TABLET | Freq: Every day | ORAL | 3 refills | Status: DC

## 2019-05-29 MED ORDER — METOPROLOL TARTRATE 50 MG PO TABS: 50.0000 mg | ORAL_TABLET | Freq: Once | ORAL | 0 refills | Status: DC

## 2019-05-29 NOTE — Progress Notes (Signed)
Cardiology Office Note:    Date:  05/29/2019   ID:  Clent Ridges, DOB [birth year], MRN [id number]  PCP:  Daisy Floro, MD  Cardiologist:  No primary care provider on file.  Electrophysiologist:  None   Referring MD: Daisy Floro, MD   Chief complaint: Fatigue and chest pain  History of Present Illness:    [name] [name] is a 59 y.o. female with a hx of hypertension hyperlipidemia followed by Dr. Tenny Craw, with very significant family history of premature coronary artery disease.  Her father had first myocardial infarction in his early 29s as well as his 2 brothers, she has twin brothers 1 of those had myocardial infarction with triple bypass at the age of 27.  She has been emotionally through a lot of stress, she lost her husband after heart transplant. She is very active and works in the yard a lot, she has noticed significant fatigue and some atypical chest pain especially after she received second Covid vaccine.  She walks frequently.  She denies any dizziness or syncope no lower extremity edema.  She has previously been intolerant to Lipitor and currently takes a very low-dose of Crestor 5 mg that she only takes 4 days a week and only half of a pill.  Her most recent lipids were LDL 107, triglycerides 237, HDL 62.  Normal LFTs.  Past Medical History:  Diagnosis Date  . Anemia   . Anxiety   . GAD (generalized anxiety disorder)   . GERD (gastroesophageal reflux disease)   . Grief reaction   . Headache(784.0)   . History of adenomatous polyp of colon    tubular adenoma 07-31-2015  . HTN (hypertension)   . Hypercholesterolemia   . Hyperlipidemia   . Iron deficiency   . Left lower quadrant pain    chronic  . Moderate recurrent major depression (HCC)   . RLS (restless legs syndrome)   . Sleep disturbance   . Vitamin D deficiency   . Wears glasses   . White coat syndrome without hypertension     Past Surgical History:  Procedure Laterality Date  . ABDOMINAL  HYSTERECTOMY    . COLONOSCOPY  last one 07-31-2015  . CYSTOSCOPY  12/12/2011   Procedure: CYSTOSCOPY;  Surgeon: Meriel Pica, MD;  Location: [facility];  Service: Gynecology;  Laterality: N/A;  . DILATION AND CURETTAGE OF UTERUS  1988  . LAPAROSCOPIC ASSISTED VAGINAL HYSTERECTOMY  12/29/2005  . LAPAROSCOPIC SALPINGO OOPHERECTOMY Left 09/21/2015   Procedure: LAPAROSCOPIC SALPINGO OOPHORECTOMY,;  Surgeon: Richarda Overlie, MD;  [facility];  Service: Gynecology;  Laterality: Left;  . OPEN REDUCTION INTERNAL FIXATION (ORIF) DISTAL RADIAL FRACTURE Left 10/27/2016   Procedure: OPEN REDUCTION INTERNAL FIXATION (ORIF) DISTAL RADIAL FRACTURE;  Surgeon: Betha Loa, MD;  Location: [facility];  Service: Orthopedics;  Laterality: Left;  . PUBOVAGINAL SLING  12/12/2011   Procedure: Leonides Grills;  Surgeon: Meriel Pica, MD;  Location: [facility];  Service: Gynecology;  Laterality: N/A;  Solyx Single Incision Sling    Current Medications: Current Meds  Medication Sig  . b complex vitamins tablet Take 1 tablet by mouth daily.  Marland Kitchen buPROPion (WELLBUTRIN XL) 300 MG 24 hr tablet Take 300 mg by mouth every morning.  . calcium-vitamin D (OSCAL WITH D) 500-200 MG-UNIT per tablet Take 1 tablet by mouth daily.  . CRESTOR 5 MG tablet Take 1 tablet by mouth every morning.   Marland Kitchen estradiol (VIVELLE-DOT) 0.05 MG/24HR patch APP 1 PATCH TO  SKIN TWICE WEEKLY UTD  . ferrous sulfate 325 (65 FE) MG EC tablet Take 325 mg by mouth daily with breakfast.  . Garlic 8416 MG CAPS Take 1 capsule by mouth daily.  Marland Kitchen ibuprofen (ADVIL,MOTRIN) 200 MG tablet Take 400-600 mg by mouth every 8 (eight) hours as needed. For headache  . LORazepam (ATIVAN) 0.5 MG tablet Take 0.5 mg by mouth every 8 (eight) hours as needed. Pt takes half tablet for anxiety.  . pantoprazole (PROTONIX) 40 MG tablet Take 40 mg by mouth 2 (two) times daily.  Marland Kitchen rOPINIRole (REQUIP) 0.25 MG tablet Take 1 tablet (0.25 mg total)  by mouth daily.  . vitamin C (ASCORBIC ACID) 500 MG tablet Take 500 mg by mouth daily.  Marland Kitchen VITAMIN D, ERGOCALCIFEROL, PO Take 1 tablet by mouth daily.  Marland Kitchen VITAMIN E PO Take 1 tablet by mouth daily.     Allergies:   Lipitor [atorvastatin], Lyrica [pregabalin], Penicillins, and Strawberry extract   Social History   Socioeconomic History  . Marital status: Married    Spouse name: Camila Li  . Number of children: 2  . Years of education: College  . Highest education level: Not on file  Occupational History  . Occupation: CNA    Employer: [employer]: retired from [employer]  . Smoking status: Never Smoker  . Smokeless tobacco: Never Used  Substance and Sexual Activity  . Alcohol use: Yes    Comment: social  . Drug use: No  . Sexual activity: Not on file  Other Topics Concern  . Not on file  Social History Narrative   Patient lives at home with family.   Caffeine Use: 4 cups of tea daily   Social Determinants of Health   Financial Resource Strain:   . Difficulty of Paying Living Expenses:   Food Insecurity:   . Worried About Charity fundraiser in the Last Year:   . Arboriculturist in the Last Year:   Transportation Needs:   . Film/video editor (Medical):   Marland Kitchen Lack of Transportation (Non-Medical):   Physical Activity:   . Days of Exercise per Week:   . Minutes of Exercise per Session:   Stress:   . Feeling of Stress :   Social Connections:   . Frequency of Communication with Friends and Family:   . Frequency of Social Gatherings with Friends and Family:   . Attends Religious Services:   . Active Member of Clubs or Organizations:   . Attends Archivist Meetings:   Marland Kitchen Marital Status:      Family History: The patient's family history includes Heart Problems in her father; Heart attack in her brother and father.  ROS:   Please see the history of present illness.    All other systems reviewed and are  negative.  EKGs/Labs/Other Studies Reviewed:    The following studies were reviewed today:  EKG:  EKG is ordered today.  The ekg ordered today demonstrates normal sinus rhythm, normal EKG, personally reviewed.  Recent Labs: No results found for requested labs within last 8760 hours.  Recent Lipid Panel No results found for: CHOL, TRIG, HDL, CHOLHDL, VLDL, LDLCALC, LDLDIRECT  Physical Exam:    VS:  BP (!) 146/86   Pulse 77   Ht 5\' 5"  (1.651 m)   Wt 185 lb 12.8 oz (84.3 kg)   SpO2 95%   BMI 30.92 kg/m     Wt Readings from Last 3  Encounters:  05/29/19 185 lb 12.8 oz (84.3 kg)  08/16/17 183 lb 12.8 oz (83.4 kg)  10/27/16 177 lb (80.3 kg)     GEN:  Well nourished, well developed in no acute distress HEENT: Normal NECK: No JVD; No carotid bruits LYMPHATICS: No lymphadenopathy CARDIAC: RRR, no murmurs, rubs, gallops RESPIRATORY:  Clear to auscultation without rales, wheezing or rhonchi  ABDOMEN: Soft, non-tender, non-distended MUSCULOSKELETAL:  No edema; No deformity  SKIN: Warm and dry NEUROLOGIC:  Alert and oriented x 3 PSYCHIATRIC:  Normal affect   ASSESSMENT:    1. Family history of early CAD   2. Precordial pain   3. Essential hypertension   4. Hyperlipidemia, unspecified hyperlipidemia type   5. Fatigue, unspecified type    PLAN:    In order of problems listed above:  1. Fatigue and atypical chest pain, yes with significant family history and uncontrolled lipids I will proceed with coronary CTA to further evaluate and adjust her medication based on her results.  We will also obtain an echocardiogram to evaluate for LV systolic function. 2. Family history of premature coronary artery disease.  We will adjust cholesterol management based on coronary CT results, will add aspirin 81 mg to her regimen. 3. Hypertension -slightly elevated today however patient controlled at home.   Medication Adjustments/Labs and Tests Ordered: Current medicines are reviewed at  length with the patient today.  Concerns regarding medicines are outlined above.  Orders Placed This Encounter  Procedures  . CT CORONARY MORPH W/CTA COR W/SCORE W/CA W/CM &/OR WO/CM  . CT CORONARY FRACTIONAL FLOW RESERVE DATA PREP  . CT CORONARY FRACTIONAL FLOW RESERVE FLUID ANALYSIS  . Basic metabolic panel  . EKG 12-Lead  . ECHOCARDIOGRAM COMPLETE   Meds ordered this encounter  Medications  . metoprolol tartrate (LOPRESSOR) 50 MG tablet    Sig: Take 1 tablet (50 mg total) by mouth once for 1 dose. Take 2 hours prior to your Coronary CT.    Dispense:  1 tablet    Refill:  0  . aspirin EC 81 MG tablet    Sig: Take 1 tablet (81 mg total) by mouth daily.    Dispense:  90 tablet    Refill:  3    There are no Patient Instructions on file for this visit.   Signed, Tobias Alexander, MD  05/29/2019 9:50 AM     [facility]

## 2019-05-29 NOTE — Patient Instructions (Signed)
Medication Instructions:   START TAKING ASPIRIN 81 MG BY MOUTH DAILY  *If you need a refill on your cardiac medications before your next appointment, please call your pharmacy*  Testing/Procedures:  Your physician has requested that you have an echocardiogram. Echocardiography is a painless test that uses sound waves to create images of your heart. It provides your doctor with information about the size and shape of your heart and how well your heart's chambers and valves are working. This procedure takes approximately one hour. There are no restrictions for this procedure.   Your cardiac CT will be scheduled at one of the below locations:   [facility] [address] [place], [place] [address] [phone]  If scheduled at [facility], please arrive at the [facility] main entrance of [facility] 30 minutes prior to test start time. Proceed to the [facility] Radiology Department (first floor) to check-in and test prep.  Please follow these instructions carefully (unless otherwise directed):  On the Night Before the Test: . Be sure to Drink plenty of water. . Do not consume any caffeinated/decaffeinated beverages or chocolate 12 hours prior to your test. . Do not take any antihistamines 12 hours prior to your test.  On the Day of the Test: . Drink plenty of water. Do not drink any water within one hour of the test. . Do not eat any food 4 hours prior to the test. . You may take your regular medications prior to the test.  . Take metoprolol 50 MG BY MOUTH (Lopressor) two hours prior to test. . FEMALES- please wear underwire-free bra if available      After the Test: . Drink plenty of water. . After receiving IV contrast, you may experience a mild flushed feeling. This is normal. . On occasion, you may experience a mild rash up to 24 hours after the test. This is not dangerous. If this occurs, you can take Benadryl 25 mg and increase your fluid  intake. . If you experience trouble breathing, this can be serious. If it is severe call 911 IMMEDIATELY. If it is mild, please call our office.  Once we have confirmed authorization from your insurance company, we will call you to set up a date and time for your test.   For non-scheduling related questions, please contact the cardiac imaging nurse navigator should you have any questions/concerns: Rockwell Alexandria, RN Navigator Cardiac Imaging [facility] [phone] office  For scheduling needs, including cancellations and rescheduling, please call [phone].       Follow-Up:  WITH DR. Delton See ON July 12, 2019 AT 8:40 AM SLOT--SCHEDULING PLEASE ADD THE PATIENT TO THIS SLOT

## 2019-06-20 ENCOUNTER — Ambulatory Visit (HOSPITAL_COMMUNITY): Payer: 59 | Attending: Cardiology

## 2019-06-20 ENCOUNTER — Other Ambulatory Visit: Payer: Self-pay

## 2019-06-20 DIAGNOSIS — Z8249 Family history of ischemic heart disease and other diseases of the circulatory system: Secondary | ICD-10-CM | POA: Diagnosis present

## 2019-06-20 DIAGNOSIS — R5383 Other fatigue: Secondary | ICD-10-CM | POA: Diagnosis present

## 2019-06-20 DIAGNOSIS — I10 Essential (primary) hypertension: Secondary | ICD-10-CM | POA: Insufficient documentation

## 2019-06-20 DIAGNOSIS — R072 Precordial pain: Secondary | ICD-10-CM | POA: Insufficient documentation

## 2019-06-20 DIAGNOSIS — E785 Hyperlipidemia, unspecified: Secondary | ICD-10-CM | POA: Diagnosis present

## 2019-06-27 ENCOUNTER — Telehealth: Payer: Self-pay | Admitting: Cardiology

## 2019-06-27 NOTE — Telephone Encounter (Signed)
Dr. Delton See, the pt just had an echo done and everything was normal.  She had to push her Coronary CT out to 6/22, due to last minute vacation plans her children surprised her with.  She is to follow-up with you on 6/18, but being her Coronary CT is pushed out to 6/22 for her vacation plans, she is wondering if she can cancel the 6/18 follow-up visit with you, and schedule her next follow-up appointment with you sometime in late Aug, or early Sept.  Pt states she can come to the 6/18 appt, but she would have to leave the beach early, and her CT will not be done until 6/22.  She confirmed she will definitely be present for her 6/22 Coronary CT appt.  Please advise on follow-up.  She is aware that I will call her back with your recommendations.

## 2019-06-27 NOTE — Telephone Encounter (Signed)
That's perfectly fine with me! We will arrange a follow up appointment based on her CT results. KN

## 2019-06-27 NOTE — Telephone Encounter (Signed)
Patient wanted to know what the purpose of the appointment she is scheduled for on 07/12/19. She will be at the beach and needs to know if she needs to reschedule or just come back from the beach early

## 2019-06-28 NOTE — Telephone Encounter (Signed)
Left the pt a detailed message to call the office back, to endorse follow-up recommendations per Dr. Delton See.

## 2019-07-01 NOTE — Telephone Encounter (Signed)
Spoke with the pt and informed her that per Dr. Delton See, she is fine with [place] cancelling her 6/18 appt with her, and we will arrange the next follow-up appt, based on the findings from her Coronary CT results on 6/22.  Pt verbalized understanding and agrees with this plan.

## 2019-07-12 ENCOUNTER — Ambulatory Visit: Payer: 59 | Admitting: Cardiology

## 2019-07-12 ENCOUNTER — Telehealth (HOSPITAL_COMMUNITY): Payer: Self-pay | Admitting: *Deleted

## 2019-07-12 NOTE — Telephone Encounter (Signed)
Reaching out to patient to offer assistance regarding upcoming cardiac imaging study; pt verbalizes understanding of appt date/time, parking situation and where to check in, pre-test NPO status and medications ordered, and verified current allergies; name and call back number provided for further questions should they arise  [facility] [phone] office [phone] cell  Pt verbalized understanding of needing a driver if pt takes her Ativan.

## 2019-07-15 ENCOUNTER — Other Ambulatory Visit (HOSPITAL_COMMUNITY): Payer: 59

## 2019-07-16 ENCOUNTER — Other Ambulatory Visit: Payer: Self-pay

## 2019-07-16 ENCOUNTER — Ambulatory Visit (HOSPITAL_COMMUNITY)
Admission: RE | Admit: 2019-07-16 | Discharge: 2019-07-16 | Disposition: A | Discharge status: Home / Self Care | Payer: 59 | Source: Ambulatory Visit | Attending: Cardiology | Admitting: Cardiology

## 2019-07-16 ENCOUNTER — Encounter (HOSPITAL_COMMUNITY): Payer: Self-pay

## 2019-07-16 DIAGNOSIS — E785 Hyperlipidemia, unspecified: Secondary | ICD-10-CM | POA: Diagnosis present

## 2019-07-16 DIAGNOSIS — Z8249 Family history of ischemic heart disease and other diseases of the circulatory system: Secondary | ICD-10-CM | POA: Diagnosis present

## 2019-07-16 DIAGNOSIS — I10 Essential (primary) hypertension: Secondary | ICD-10-CM | POA: Insufficient documentation

## 2019-07-16 DIAGNOSIS — R5383 Other fatigue: Secondary | ICD-10-CM | POA: Diagnosis present

## 2019-07-16 DIAGNOSIS — R072 Precordial pain: Secondary | ICD-10-CM | POA: Insufficient documentation

## 2019-07-16 DIAGNOSIS — Z006 Encounter for examination for normal comparison and control in clinical research program: Secondary | ICD-10-CM

## 2019-07-16 MED ORDER — NITROGLYCERIN 0.4 MG SL SUBL
0.8000 mg | SUBLINGUAL_TABLET | Freq: Once | SUBLINGUAL | Status: AC
  Administered 2019-07-16: 0.8 mg via SUBLINGUAL

## 2019-07-16 MED ORDER — IOHEXOL 350 MG/ML SOLN
80.0000 mL | Freq: Once | INTRAVENOUS | Status: AC | PRN
  Administered 2019-07-16: 80 mL via INTRAVENOUS

## 2019-07-16 MED ORDER — NITROGLYCERIN 0.4 MG SL SUBL
SUBLINGUAL_TABLET | SUBLINGUAL | Status: AC
  Filled 2019-07-16: qty 2

## 2019-07-16 NOTE — Research (Signed)
Cadfem Informed Consent    Patient Name: [name] [name]     Subject met inclusion and exclusion criteria.  The informed consent form, study requirements and expectations were reviewed with the subject and questions and concerns were addressed prior to the signing of the consent form.  The subject verbalized understanding of the trail requirements.  The subject agreed to participate in the CADFEM trial and signed the informed consent.  The informed consent was obtained prior to performance of any protocol-specific procedures for the subject.  A copy of the signed informed consent was given to the subject and a copy was placed in the subject's medical record.   Neva Seat

## 2019-07-17 ENCOUNTER — Telehealth: Payer: Self-pay | Admitting: *Deleted

## 2019-07-17 NOTE — Telephone Encounter (Signed)
Pt has been notified of CT results by phone with verbal understanding. Pt would like CT results to be faxed to PCP, I have faxed  results to PCP Dr. Duane Lope. Pt asked if she still needs an appt with Dr. Delton See. I assured the pt that I will send a note to DR. Nelson's nurse Lajoyce Corners and she will reach out to her in regards to appt. Pt thanked me for the call. Patient notified of result.  Please refer to phone note from today for complete details.   Danielle Rankin, CMA 07/17/2019 10:08 AM

## 2019-07-17 NOTE — Telephone Encounter (Signed)
-----   Message from Loa Socks, LPN sent at 4/38/3818  7:23 AM EDT -----  ----- Message ----- From: Lars Masson, MD Sent: 07/16/2019  10:14 AM EDT To: Loa Socks, LPN  No evidence for CAD, this is a great result!! Small nodules on chest CTA are of small concern as she has benevr smoked.

## 2019-07-17 NOTE — Telephone Encounter (Signed)
Scheduled the pt to come in and see Dr. Delton See for 10/15/19 at 0820.  She is aware to arrive 15 mins prior to this appt.  Pt verbalized understanding and agrees with this plan. Pt appreciative for all the assistance provided.

## 2019-07-22 ENCOUNTER — Other Ambulatory Visit: Payer: Self-pay | Admitting: Diagnostic Neuroimaging

## 2019-08-31 ENCOUNTER — Other Ambulatory Visit: Payer: Self-pay | Admitting: Diagnostic Neuroimaging

## 2019-09-03 ENCOUNTER — Other Ambulatory Visit: Payer: Self-pay | Admitting: Neurology

## 2019-09-03 ENCOUNTER — Telehealth: Payer: Self-pay | Admitting: Diagnostic Neuroimaging

## 2019-09-03 MED ORDER — ROPINIROLE HCL 0.25 MG PO TABS: 0.2500 mg | ORAL_TABLET | Freq: Every day | ORAL | 0 refills | Status: DC

## 2019-09-03 NOTE — Telephone Encounter (Signed)
Pt request refill rOPINIRole (REQUIP) 0.25 MG tablet at [id number]. Scheduled an appt with the NP on 09/19/19

## 2019-09-03 NOTE — Telephone Encounter (Signed)
Refill sent for the patient. Must keep the scheduled apt coming up for future refills.

## 2019-09-19 ENCOUNTER — Ambulatory Visit: Payer: Self-pay | Admitting: Adult Health

## 2019-10-15 ENCOUNTER — Other Ambulatory Visit: Payer: Self-pay

## 2019-10-15 ENCOUNTER — Encounter: Payer: Self-pay | Admitting: Cardiology

## 2019-10-15 ENCOUNTER — Ambulatory Visit (INDEPENDENT_AMBULATORY_CARE_PROVIDER_SITE_OTHER): Payer: 59 | Admitting: Cardiology

## 2019-10-15 VITALS — BP 128/84 | HR 65 | Ht 65.0 in | Wt 179.8 lb

## 2019-10-15 DIAGNOSIS — Z8249 Family history of ischemic heart disease and other diseases of the circulatory system: Secondary | ICD-10-CM

## 2019-10-15 DIAGNOSIS — E785 Hyperlipidemia, unspecified: Secondary | ICD-10-CM

## 2019-10-15 DIAGNOSIS — I10 Essential (primary) hypertension: Secondary | ICD-10-CM | POA: Diagnosis not present

## 2019-10-15 DIAGNOSIS — R072 Precordial pain: Secondary | ICD-10-CM | POA: Diagnosis not present

## 2019-10-15 MED ORDER — ROSUVASTATIN CALCIUM 5 MG PO TABS: ORAL_TABLET | ORAL | 3 refills | Status: AC

## 2019-10-15 NOTE — Patient Instructions (Signed)
Medication Instructions:   INCREASE YOUR ROSUVASTATIN (CRESTOR) TO 2.5 MG BY MOUTH DAILY 5 DAYS A WEEK--TAKE Monday THROUGH Friday   *If you need a refill on your cardiac medications before your next appointment, please call your pharmacy*   Follow-Up: At [facility], you and your health needs are our priority.  As part of our continuing mission to provide you with exceptional heart care, we have created designated Provider Care Teams.  These Care Teams include your primary Cardiologist (physician) and Advanced Practice Providers (APPs -  Physician Assistants and Nurse Practitioners) who all work together to provide you with the care you need, when you need it.  We recommend signing up for the patient portal called "MyChart".  Sign up information is provided on this After Visit Summary.  MyChart is used to connect with patients for Virtual Visits (Telemedicine).  Patients are able to view lab/test results, encounter notes, upcoming appointments, etc.  Non-urgent messages can be sent to your provider as well.   To learn more about what you can do with MyChart, go to [email or web address].    Your next appointment:   12 month(s)  The format for your next appointment:   In Person  Provider:   Tobias Alexander, MD

## 2019-10-15 NOTE — Progress Notes (Signed)
Cardiology Office Note:    Date:  10/15/2019   ID:  Clent Ridges, DOB Jun 20, 1960, MRN [id number]  PCP:  Daisy Floro, MD  Cardiologist:  No primary care provider on file.  Electrophysiologist:  None   Referring MD: Daisy Floro, MD   Chief complaint: Fatigue and chest pain  History of Present Illness:    [name] [name] is a 59 y.o. female with a hx of hypertension hyperlipidemia followed by Dr. Tenny Craw, with very significant family history of premature coronary artery disease.  Her father had first myocardial infarction in his early 12s as well as his 2 brothers, she has twin brothers 1 of those had myocardial infarction with triple bypass at the age of 84.  She has been emotionally through a lot of stress, she lost her husband after heart transplant. She is very active and works in the yard a lot, she has noticed significant fatigue and some atypical chest pain especially after she received second Covid vaccine.  She walks frequently.  She denies any dizziness or syncope no lower extremity edema.  She has previously been intolerant to Lipitor and currently takes a very low-dose of Crestor 5 mg that she only takes 4 days a week and only half of a pill.  Her most recent lipids were LDL 107, triglycerides 237, HDL 62.  Normal LFTs.  10/15/2019 -the patient is coming after 6 months, she has been doing better, she is now walking twice a week 3 to 5 miles without any symptoms.  She is tolerating Crestor 2.5 mg 3 times a week.  She admits to eating comfort foods like cheese and crackers and eating and fast foods.  Previously on very healthy diet with her husband was heart transplantation.  Unfortunately he passed away 3 years ago.  She underwent coronary CTA that showed calcium score of 0 and no evidence of coronary artery disease.  Past Medical History:  Diagnosis Date  . Anemia   . Anxiety   . GAD (generalized anxiety disorder)   . GERD (gastroesophageal reflux disease)   . Grief  reaction   . Headache(784.0)   . History of adenomatous polyp of colon    tubular adenoma 07-31-2015  . HTN (hypertension)   . Hypercholesterolemia   . Hyperlipidemia   . Iron deficiency   . Left lower quadrant pain    chronic  . Moderate recurrent major depression (HCC)   . RLS (restless legs syndrome)   . Sleep disturbance   . Vitamin D deficiency   . Wears glasses   . White coat syndrome without hypertension     Past Surgical History:  Procedure Laterality Date  . ABDOMINAL HYSTERECTOMY    . COLONOSCOPY  last one 07-31-2015  . CYSTOSCOPY  12/12/2011   Procedure: CYSTOSCOPY;  Surgeon: Meriel Pica, MD;  Location: [facility];  Service: Gynecology;  Laterality: N/A;  . DILATION AND CURETTAGE OF UTERUS  1988  . LAPAROSCOPIC ASSISTED VAGINAL HYSTERECTOMY  12/29/2005  . LAPAROSCOPIC SALPINGO OOPHERECTOMY Left 09/21/2015   Procedure: LAPAROSCOPIC SALPINGO OOPHORECTOMY,;  Surgeon: Richarda Overlie, MD;  [facility]: [facility];  Service: Gynecology;  Laterality: Left;  . OPEN REDUCTION INTERNAL FIXATION (ORIF) DISTAL RADIAL FRACTURE Left 10/27/2016   Procedure: OPEN REDUCTION INTERNAL FIXATION (ORIF) DISTAL RADIAL FRACTURE;  Surgeon: Betha Loa, MD;  [facility];  Service: Orthopedics;  Laterality: Left;  . PUBOVAGINAL SLING  12/12/2011   Procedure: Leonides Grills;  Surgeon: Meriel Pica, MD;  Location: [facility];  Service: Gynecology;  Laterality: N/A;  Solyx Single Incision Sling    Current Medications: Current Meds  Medication Sig  . aspirin EC 81 MG tablet Take 1 tablet (81 mg total) by mouth daily.  Marland Kitchen b complex vitamins tablet Take 1 tablet by mouth daily.  Marland Kitchen buPROPion (WELLBUTRIN XL) 300 MG 24 hr tablet Take 300 mg by mouth every morning.  . calcium-vitamin D (OSCAL WITH D) 500-200 MG-UNIT per tablet Take 1 tablet by mouth daily.  Marland Kitchen estradiol (VIVELLE-DOT) 0.05 MG/24HR patch APP 1 PATCH TO SKIN TWICE WEEKLY UTD  . ferrous sulfate  325 (65 FE) MG EC tablet Take 325 mg by mouth daily with breakfast.  . Garlic 1000 MG CAPS Take 1 capsule by mouth daily.  Marland Kitchen ibuprofen (ADVIL,MOTRIN) 200 MG tablet Take 400-600 mg by mouth every 8 (eight) hours as needed. For headache  . LORazepam (ATIVAN) 0.5 MG tablet Take 0.5 mg by mouth every 8 (eight) hours as needed. Pt takes half tablet for anxiety.  . metoprolol succinate (TOPROL-XL) 25 MG 24 hr tablet Take 25 mg by mouth daily.  . pantoprazole (PROTONIX) 40 MG tablet Take 40 mg by mouth 2 (two) times daily.  Marland Kitchen rOPINIRole (REQUIP) 0.25 MG tablet Take 1 tablet (0.25 mg total) by mouth daily.  . vitamin C (ASCORBIC ACID) 500 MG tablet Take 500 mg by mouth daily.  Marland Kitchen VITAMIN D, ERGOCALCIFEROL, PO Take 1 tablet by mouth daily.  Marland Kitchen VITAMIN E PO Take 1 tablet by mouth daily.  . [DISCONTINUED] rosuvastatin (CRESTOR) 5 MG tablet Take 2.5 mg by mouth 3 (three) times a week.     Allergies:   Lipitor [atorvastatin], Lyrica [pregabalin], Penicillins, and Strawberry extract   Social History   Socioeconomic History  . Marital status: Married    Spouse name: Jena Gauss  . Number of children: 2  . Years of education: College  . Highest education level: Not on file  Occupational History  . Occupation: CNA    Employer: [employer]    Comment: retired from [employer]  . Smoking status: Never Smoker  . Smokeless tobacco: Never Used  Substance and Sexual Activity  . Alcohol use: Yes    Comment: social  . Drug use: No  . Sexual activity: Not on file  Other Topics Concern  . Not on file  Social History Narrative   Patient lives at home with family.   Caffeine Use: 4 cups of tea daily   Social Determinants of Health   Financial Resource Strain:   . Difficulty of Paying Living Expenses: Not on file  Food Insecurity:   . Worried About Programme researcher, broadcasting/film/video in the Last Year: Not on file  . Ran Out of Food in the Last Year: Not on file  Transportation Needs:   . Lack of  Transportation (Medical): Not on file  . Lack of Transportation (Non-Medical): Not on file  Physical Activity:   . Days of Exercise per Week: Not on file  . Minutes of Exercise per Session: Not on file  Stress:   . Feeling of Stress : Not on file  Social Connections:   . Frequency of Communication with Friends and Family: Not on file  . Frequency of Social Gatherings with Friends and Family: Not on file  . Attends Religious Services: Not on file  . Active Member of Clubs or Organizations: Not on file  . Attends Banker Meetings: Not on file  . Marital Status:  Not on file     Family History: The patient's family history includes Heart Problems in her father; Heart attack in her brother and father.  ROS:   Please see the history of present illness.    All other systems reviewed and are negative.  EKGs/Labs/Other Studies Reviewed:    The following studies were reviewed today:  EKG:  EKG is ordered today.  The ekg ordered today demonstrates normal sinus rhythm, normal EKG, personally reviewed.  Recent Labs: No results found for requested labs within last 8760 hours.  Recent Lipid Panel No results found for: CHOL, TRIG, HDL, CHOLHDL, VLDL, LDLCALC, LDLDIRECT  Physical Exam:    VS:  BP 128/84   Pulse 65   Ht 5\' 5"  (1.651 m)   Wt 179 lb 12.8 oz (81.6 kg)   SpO2 98%   BMI 29.92 kg/m     Wt Readings from Last 3 Encounters:  10/15/19 179 lb 12.8 oz (81.6 kg)  05/29/19 185 lb 12.8 oz (84.3 kg)  08/16/17 183 lb 12.8 oz (83.4 kg)     GEN:  Well nourished, well developed in no acute distress HEENT: Normal NECK: No JVD; No carotid bruits LYMPHATICS: No lymphadenopathy CARDIAC: RRR, no murmurs, rubs, gallops RESPIRATORY:  Clear to auscultation without rales, wheezing or rhonchi  ABDOMEN: Soft, non-tender, non-distended MUSCULOSKELETAL:  No edema; No deformity  SKIN: Warm and dry NEUROLOGIC:  Alert and oriented x 3 PSYCHIATRIC:  Normal affect   Coronary CTA  07/16/2019  IMPRESSION: 1. Coronary calcium score of 0. This was 0 percentile for age and sex matched control. 2. Normal coronary origin with right dominance. 3. CAD-RADS 0. No evidence of CAD (0%). Consider non-atherosclerotic causes of chest pain.   ASSESSMENT:    1. Essential hypertension   2. Hyperlipidemia, unspecified hyperlipidemia type   3. Precordial pain   4. Family history of early CAD     PLAN:    In order of problems listed above:  1. Fatigue and atypical chest pain, yes with significant family history and uncontrolled lipids we ordered coronary CTA that showed calcium score of 0 and no evidence for coronary artery disease.  We will focus on primary prevention, we will increase Crestor to 2.5 mg 5 times a week, exercise for at least 30 minutes 5 days a week, and focusing on healthy Mediterranean style diet. 2. Hyperlipidemia -LDL and HDL at goal however triglycerides 307, will increase Crestor. 3. Hypertension -controlled..   Medication Adjustments/Labs and Tests Ordered: Current medicines are reviewed at length with the patient today.  Concerns regarding medicines are outlined above.  Orders Placed This Encounter  Procedures  . EKG 12-Lead   Meds ordered this encounter  Medications  . rosuvastatin (CRESTOR) 5 MG tablet    Sig: Take 0.5 tablets (2.5 mg total) by mouth daily 5 times a week.  Take Mon-Fri.    Dispense:  45 tablet    Refill:  3    Dose change-increased    There are no Patient Instructions on file for this visit.   Signed, 07/18/2019, MD  10/15/2019 8:41 AM    [facility]

## 2019-12-10 ENCOUNTER — Other Ambulatory Visit: Payer: Self-pay | Admitting: Diagnostic Neuroimaging

## 2019-12-23 ENCOUNTER — Ambulatory Visit (INDEPENDENT_AMBULATORY_CARE_PROVIDER_SITE_OTHER): Payer: 59 | Admitting: Family Medicine

## 2019-12-23 ENCOUNTER — Encounter: Payer: Self-pay | Admitting: Family Medicine

## 2019-12-23 VITALS — BP 130/74 | HR 63 | Ht 65.0 in | Wt 164.0 lb

## 2019-12-23 DIAGNOSIS — G2581 Restless legs syndrome: Secondary | ICD-10-CM

## 2019-12-23 MED ORDER — ROPINIROLE HCL 0.25 MG PO TABS: ORAL_TABLET | ORAL | 3 refills | Status: DC

## 2019-12-23 NOTE — Progress Notes (Signed)
Chief Complaint  Patient presents with  . Follow-up    RM 16  . Restless leg syndrome    pt said she is here for a annual f/u. Pt said shi is not having new sx since the last visit.     HISTORY OF PRESENT ILLNESS: Today 12/23/19  [name] [name] is a 59 y.o. female here today for follow up for  RLS. She continues ropinirole 0.25mg  daily in the mornings with good control of symptoms. No new or worsening symptoms. She continues close follow up with PCP.    HISTORY (copied from previous note)  UPDATE (06/19/18, VRP): Since last visit, doing well. Symptoms are stable. Severity is mild. No alleviating or aggravating factors. Tolerating ropinirole now (neupro not covered by insurance anymore).     REVIEW OF SYSTEMS: Out of a complete 14 system review of symptoms, the patient complains only of the following symptoms, restless legs and all other reviewed systems are negative.   ALLERGIES: Allergies  Allergen Reactions  . Lipitor [Atorvastatin] Other (See Comments)    Leg pain  . Lyrica [Pregabalin] Other (See Comments)    Leg pain  . Penicillins Other (See Comments)    Unknown Childhood reaction  . Strawberry Extract Hives     HOME MEDICATIONS: Outpatient Medications Prior to Visit  Medication Sig Dispense Refill  . aspirin EC 81 MG tablet Take 1 tablet (81 mg total) by mouth daily. 90 tablet 3  . b complex vitamins tablet Take 1 tablet by mouth daily.    Marland Kitchen buPROPion (WELLBUTRIN XL) 300 MG 24 hr tablet Take 300 mg by mouth every morning.    . calcium-vitamin D (OSCAL WITH D) 500-200 MG-UNIT per tablet Take 1 tablet by mouth daily.    Marland Kitchen estradiol (VIVELLE-DOT) 0.05 MG/24HR patch APP 1 PATCH TO SKIN TWICE WEEKLY UTD  3  . ferrous sulfate 325 (65 FE) MG EC tablet Take 325 mg by mouth daily with breakfast.    . Garlic 1000 MG CAPS Take 1 capsule by mouth daily.    Marland Kitchen ibuprofen (ADVIL,MOTRIN) 200 MG tablet Take 400-600 mg by mouth every 8 (eight) hours as needed. For headache      . LORazepam (ATIVAN) 0.5 MG tablet Take 0.5 mg by mouth every 8 (eight) hours as needed. Pt takes half tablet for anxiety.    . metoprolol succinate (TOPROL-XL) 25 MG 24 hr tablet Take 25 mg by mouth daily.    . pantoprazole (PROTONIX) 40 MG tablet Take 40 mg by mouth 2 (two) times daily.    Marland Kitchen rOPINIRole (REQUIP) 0.25 MG tablet TAKE 1 TABLET(0.25 MG) BY MOUTH DAILY 30 tablet 0  . rosuvastatin (CRESTOR) 5 MG tablet Take 0.5 tablets (2.5 mg total) by mouth daily 5 times a week.  Take Mon-Fri. 45 tablet 3  . vitamin C (ASCORBIC ACID) 500 MG tablet Take 500 mg by mouth daily.    Marland Kitchen VITAMIN D, ERGOCALCIFEROL, PO Take 1 tablet by mouth daily.    Marland Kitchen VITAMIN E PO Take 1 tablet by mouth daily.     No facility-administered medications prior to visit.     PAST MEDICAL HISTORY: Past Medical History:  Diagnosis Date  . Anemia   . Anxiety   . GAD (generalized anxiety disorder)   . GERD (gastroesophageal reflux disease)   . Grief reaction   . Headache(784.0)   . History of adenomatous polyp of colon    tubular adenoma 07-31-2015  . HTN (hypertension)   . Hypercholesterolemia   .  Hyperlipidemia   . Iron deficiency   . Left lower quadrant pain    chronic  . Moderate recurrent major depression (HCC)   . RLS (restless legs syndrome)   . Sleep disturbance   . Vitamin D deficiency   . Wears glasses   . White coat syndrome without hypertension      PAST SURGICAL HISTORY: Past Surgical History:  Procedure Laterality Date  . ABDOMINAL HYSTERECTOMY    . COLONOSCOPY  last one 07-31-2015  . CYSTOSCOPY  12/12/2011   Procedure: CYSTOSCOPY;  Surgeon: Meriel Pica, MD;  Location: [facility];  Service: Gynecology;  Laterality: N/A;  . DILATION AND CURETTAGE OF UTERUS  1988  . LAPAROSCOPIC ASSISTED VAGINAL HYSTERECTOMY  12/29/2005  . LAPAROSCOPIC SALPINGO OOPHERECTOMY Left 09/21/2015   Procedure: LAPAROSCOPIC SALPINGO OOPHORECTOMY,;  Surgeon: Richarda Overlie, MD;  Location: [facility];  Service: Gynecology;  Laterality: Left;  . OPEN REDUCTION INTERNAL FIXATION (ORIF) DISTAL RADIAL FRACTURE Left 10/27/2016   Procedure: OPEN REDUCTION INTERNAL FIXATION (ORIF) DISTAL RADIAL FRACTURE;  Surgeon: Betha Loa, MD;  Location: [facility];  Service: Orthopedics;  Laterality: Left;  . PUBOVAGINAL SLING  12/12/2011   Procedure: Leonides Grills;  Surgeon: Meriel Pica, MD;  Location: [facility];  Service: Gynecology;  Laterality: N/A;  Solyx Single Incision Sling     FAMILY HISTORY: Family History  Problem Relation Age of Onset  . Heart attack Brother   . Heart attack Father   . Heart Problems Father      SOCIAL HISTORY: Social History   Socioeconomic History  . Marital status: Married    Spouse name: Jena Gauss  . Number of children: 2  . Years of education: College  . Highest education level: Not on file  Occupational History  . Occupation: CNA    Employer: [employer]    Comment: retired from [employer]  . Smoking status: Never Smoker  . Smokeless tobacco: Never Used  Substance and Sexual Activity  . Alcohol use: Yes    Comment: social  . Drug use: No  . Sexual activity: Not on file  Other Topics Concern  . Not on file  Social History Narrative   Patient lives at home with family.   Caffeine Use: 4 cups of tea daily   Social Determinants of Health   Financial Resource Strain:   . Difficulty of Paying Living Expenses: Not on file  Food Insecurity:   . Worried About Programme researcher, broadcasting/film/video in the Last Year: Not on file  . Ran Out of Food in the Last Year: Not on file  Transportation Needs:   . Lack of Transportation (Medical): Not on file  . Lack of Transportation (Non-Medical): Not on file  Physical Activity:   . Days of Exercise per Week: Not on file  . Minutes of Exercise per Session: Not on file  Stress:   . Feeling of Stress : Not on file  Social Connections:   . Frequency of Communication with Friends and  Family: Not on file  . Frequency of Social Gatherings with Friends and Family: Not on file  . Attends Religious Services: Not on file  . Active Member of Clubs or Organizations: Not on file  . Attends Banker Meetings: Not on file  . Marital Status: Not on file  Intimate Partner Violence:   . Fear of Current or Ex-Partner: Not on file  . Emotionally Abused: Not on file  . Physically Abused:  Not on file  . Sexually Abused: Not on file      PHYSICAL EXAM  Vitals:   12/23/19 0816  BP: 130/74  Pulse: 63  Weight: 164 lb (74.4 kg)  Height: 5\' 5"  (1.651 m)   Body mass index is 27.29 kg/m.   Generalized: Well developed, in no acute distress  Cardiology: normal rate and rhythm, no murmur auscultated  Respiratory: clear to auscultation bilaterally    Neurological examination  Mentation: Alert oriented to time, place, history taking. Follows all commands speech and language fluent Cranial nerve II-XII: Pupils were equal round reactive to light. Extraocular movements were full, visual field were full on confrontational test. Facial sensation and strength were normal. Head turning and shoulder shrug  were normal and symmetric. Motor: The motor testing reveals 5 over 5 strength of all 4 extremities. Good symmetric motor tone is noted throughout.  Sensory: Sensory testing is intact to soft touch on all 4 extremities. No evidence of extinction is noted.  Coordination: Cerebellar testing reveals good finger-nose-finger and heel-to-shin bilaterally.  Gait and station: Gait is normal. Tandem gait is normal.  Reflexes: Deep tendon reflexes are symmetric and normal bilaterally.     DIAGNOSTIC DATA (LABS, IMAGING, TESTING) - I reviewed patient records, labs, notes, testing and imaging myself where available.  Lab Results  Component Value Date   WBC 9.8 09/17/2015   HGB 13.0 09/17/2015   HCT 39.0 09/17/2015   MCV 91.3 09/17/2015   PLT 292 09/17/2015   No results found  for: NA, K, CL, CO2, GLUCOSE, BUN, CREATININE, CALCIUM, PROT, ALBUMIN, AST, ALT, ALKPHOS, BILITOT, GFRNONAA, GFRAA No results found for: CHOL, HDL, LDLCALC, LDLDIRECT, TRIG, CHOLHDL No results found for: 09/19/2015 No results found for: VITAMINB12 No results found for: TSH    ASSESSMENT AND PLAN  59 y.o. year old female  has a past medical history of Anemia, Anxiety, GAD (generalized anxiety disorder), GERD (gastroesophageal reflux disease), Grief reaction, Headache(784.0), History of adenomatous polyp of colon, HTN (hypertension), Hypercholesterolemia, Hyperlipidemia, Iron deficiency, Left lower quadrant pain, Moderate recurrent major depression (HCC), RLS (restless legs syndrome), Sleep disturbance, Vitamin D deficiency, Wears glasses, and White coat syndrome without hypertension. here with   No diagnosis found.  42 is doing very well on ropinirole 0.25mg  daily. She will continue current treatment plan. She will continue oral iron supplements and follow up with PCP. Patient may continue follow up with PCP for ropinirole refills, however, she is adamant that she would like to continue follow up with Dr Ninetta Lights. She will follow up in 1 year, sooner if needed.   I spent 20 minutes of face-to-face and non-face-to-face time with patient.  This included previsit chart review, lab review, study review, order entry, electronic health record documentation, patient education.    Marjory Lies, MSN, FNP-C 12/23/2019, 8:32 AM  [facility] [address], [address] [place], [place] [phone]

## 2019-12-23 NOTE — Patient Instructions (Signed)
Below is our plan:  We will continue ropinirole 0.25mg  daily.   Please make sure you are staying well hydrated. I recommend 50-60 ounces daily. Well balanced diet and regular exercise encouraged.    Please continue follow up with care team as directed.   Follow up in 1 year   You may receive a survey regarding today's visit. I encourage you to leave honest feed back as I do use this information to improve patient care. Thank you for seeing me today!      Restless Legs Syndrome Restless legs syndrome is a condition that causes uncomfortable feelings or sensations in the legs, especially while sitting or lying down. The sensations usually cause an overwhelming urge to move the legs. The arms can also sometimes be affected. The condition can range from mild to severe. The symptoms often interfere with a person's ability to sleep. What are the causes? The cause of this condition is not known. What increases the risk? The following factors may make you more likely to develop this condition:  Being older than 50.  Pregnancy.  Being a woman. In general, the condition is more common in women than in men.  A family history of the condition.  Having iron deficiency.  Overuse of caffeine, nicotine, or alcohol.  Certain medical conditions, such as kidney disease, Parkinson's disease, or nerve damage.  Certain medicines, such as those for high blood pressure, nausea, colds, allergies, depression, and some heart conditions. What are the signs or symptoms? The main symptom of this condition is uncomfortable sensations in the legs, such as:  Pulling.  Tingling.  Prickling.  Throbbing.  Crawling.  Burning. Usually, the sensations:  Affect both sides of the body.  Are worse when you sit or lie down.  Are worse at night. These may wake you up or make it difficult to fall asleep.  Make you have a strong urge to move your legs.  Are temporarily relieved by moving your  legs. The arms can also be affected, but this is rare. People who have this condition often have tiredness during the day because of their lack of sleep at night. How is this diagnosed? This condition may be diagnosed based on:  Your symptoms.  Blood tests. In some cases, you may be monitored in a sleep lab by a specialist (a sleep study). This can detect any disruptions in your sleep. How is this treated? This condition is treated by managing the symptoms. This may include:  Lifestyle changes, such as exercising, using relaxation techniques, and avoiding caffeine, alcohol, or tobacco.  Medicines. Anti-seizure medicines may be tried first. Follow these instructions at home:     General instructions  Take over-the-counter and prescription medicines only as told by your health care provider.  Use methods to help relieve the uncomfortable sensations, such as: ? Massaging your legs. ? Walking or stretching. ? Taking a cold or hot bath.  Keep all follow-up visits as told by your health care provider. This is important. Lifestyle  Practice good sleep habits. For example, go to bed and get up at the same time every day. Most adults should get 7-9 hours of sleep each night.  Exercise regularly. Try to get at least 30 minutes of exercise most days of the week.  Practice ways of relaxing, such as yoga or meditation.  Avoid caffeine and alcohol.  Do not use any products that contain nicotine or tobacco, such as cigarettes and e-cigarettes. If you need help quitting, ask your health  care provider. Contact a health care provider if:  Your symptoms get worse or they do not improve with treatment. Summary  Restless legs syndrome is a condition that causes uncomfortable feelings or sensations in the legs, especially while sitting or lying down.  The symptoms often interfere with a person's ability to sleep.  This condition is treated by managing the symptoms. You may need to make  lifestyle changes or take medicines. This information is not intended to replace advice given to you by your health care provider. Make sure you discuss any questions you have with your health care provider. Document Revised: 01/30/2017 Document Reviewed: 01/30/2017 Elsevier Patient Education  2020 [employer].

## 2019-12-23 NOTE — Progress Notes (Signed)
I reviewed note and agree with plan.   Suanne Marker, MD 12/23/2019, 10:27 AM Certified in Neurology, Neurophysiology and Neuroimaging  [facility] [address] [place], [place] [address] [phone]

## 2020-02-18 ENCOUNTER — Telehealth: Payer: Self-pay | Admitting: Family Medicine

## 2020-02-18 NOTE — Telephone Encounter (Signed)
Pt called, is there another restless leg medication I could try. I am on rOPINIRole (REQUIP) 0.25 MG tablet. Would like a call from the nurse.

## 2020-02-19 MED ORDER — GABAPENTIN 300 MG PO CAPS: 300.0000 mg | ORAL_CAPSULE | Freq: Every day | ORAL | 1 refills | Status: DC

## 2020-02-19 NOTE — Telephone Encounter (Signed)
Please let her know that there is data to support concerns of obsessive behaviors like excessive shopping, spending large amounts of money, gambling, etc with ropinirole. Same applies with Neupro. It is hard to tell what the right choice is here. If she feels that shopping is unusual and causing trouble with spending too much money, etc, we can consider switching her to gabapentin. This is similar to Lyrica. She has taken Lyrica in the past that caused a reaction of worsening leg pain. It is hard to say if this will happen with gabapentin. More common symptoms are lightheadedness, sleepiness, dizziness, etc. She is on a very low dose of ropinirole. If she feels that concerns of increased shopping are unusual for her and would like to try gabapentin I am happy to switch her. We would need to bring her in for an appt in about 6-12 weeks to assess response. TY.

## 2020-02-19 NOTE — Telephone Encounter (Signed)
I called pt and relayed the information.  She does want to change over toe gabapentin.  Relayed the SE for that.  She will stop the ropinirole and start gabapentin when she has this.  [facility]. Made VV 03-17-20 for f/u.

## 2020-02-19 NOTE — Telephone Encounter (Signed)
Spoke to pt and asked if a side effect of ropinirole is (increased shopping, gambling).  She has noted increased shopping since she started taking.  (she is only asking because her daughter was told of these SE when in for appt with Dr. Marijo Sanes).  She was on Neupro patch prior.  If this is SE she asked if could try something different to see if SE would go away.  Please advise.

## 2020-03-12 NOTE — Progress Notes (Deleted)
   PATIENT: [name] [name] DOB: 07/07/1960  REASON FOR VISIT: follow up HISTORY FROM: patient  Virtual Visit via Telephone Note  I connected with [name] [name] on 03/12/20 at  8:00 AM EST by telephone and verified that I am speaking with the correct person using two identifiers.   I discussed the limitations, risks, security and privacy concerns of performing an evaluation and management service by telephone and the availability of in person appointments. I also discussed with the patient that there may be a patient responsible charge related to this service. The patient expressed understanding and agreed to proceed.   History of Present Illness:  03/12/20 ALL: [name] [name] is a 60 y.o. female here today for follow up for RLS. She was last seen 11/2019 and doing well on ropinirole 0.25mg  daily. She called with concerns of side effects from medication stating that she was shopping more than normal. We switched her to gabapentin 300mg  at bedtime.    12/23/2019 ALL:  [name] [name] is a 60 y.o. female here today for follow up for  RLS. She continues ropinirole 0.25mg  daily in the mornings with good control of symptoms. No new or worsening symptoms. She continues close follow up with PCP.    HISTORY (copied from previous note)  UPDATE (06/19/18, VRP): Since last visit, doingwell. Symptoms arestable. Severityis mild. No alleviating or aggravating factors. Toleratingropinirole now (neupro not covered by insurance anymore).   Observations/Objective:  Generalized: Well developed, in no acute distress  Mentation: Alert oriented to time, place, history taking. Follows all commands speech and language fluent   Assessment and Plan:  60 y.o. year old female  has a past medical history of Anemia, Anxiety, GAD (generalized anxiety disorder), GERD (gastroesophageal reflux disease), Grief reaction, Headache(784.0), History of adenomatous polyp of colon, HTN (hypertension),  Hypercholesterolemia, Hyperlipidemia, Iron deficiency, Left lower quadrant pain, Moderate recurrent major depression (HCC), RLS (restless legs syndrome), Sleep disturbance, Vitamin D deficiency, Wears glasses, and White coat syndrome without hypertension. here with    ICD-10-CM   1. Restless leg syndrome  G25.81     No orders of the defined types were placed in this encounter.   No orders of the defined types were placed in this encounter.    Follow Up Instructions:  I discussed the assessment and treatment plan with the patient. The patient was provided an opportunity to ask questions and all were answered. The patient agreed with the plan and demonstrated an understanding of the instructions.   The patient was advised to call back or seek an in-person evaluation if the symptoms worsen or if the condition fails to improve as anticipated.  I provided *** minutes of non-face-to-face time during this encounter.   41, NP

## 2020-03-17 ENCOUNTER — Telehealth: Payer: Self-pay | Admitting: Family Medicine

## 2020-03-17 DIAGNOSIS — G2581 Restless legs syndrome: Secondary | ICD-10-CM

## 2020-04-02 IMAGING — MG MM DIGITAL DIAGNOSTIC UNILAT*R* W/ TOMO W/ CAD
4 series · 4 of 12 positions shown · non-contrast
Comparison: 05/28/2018 and earlier

CLINICAL DATA: Patient describes diffuse pain throughout the LOWER
and LATERAL portions of the RIGHT breast. Patient is tender on exam
the particularly along the LOWER portion of the breast.The patient
describes an 8 pound weight loss over the last year. She uses the
estrogen patch. Doses have recently changed.

EXAM:
DIGITAL DIAGNOSTIC UNILATERAL RIGHT MAMMOGRAM WITH CAD AND TOMO

[R CC synth-2D]
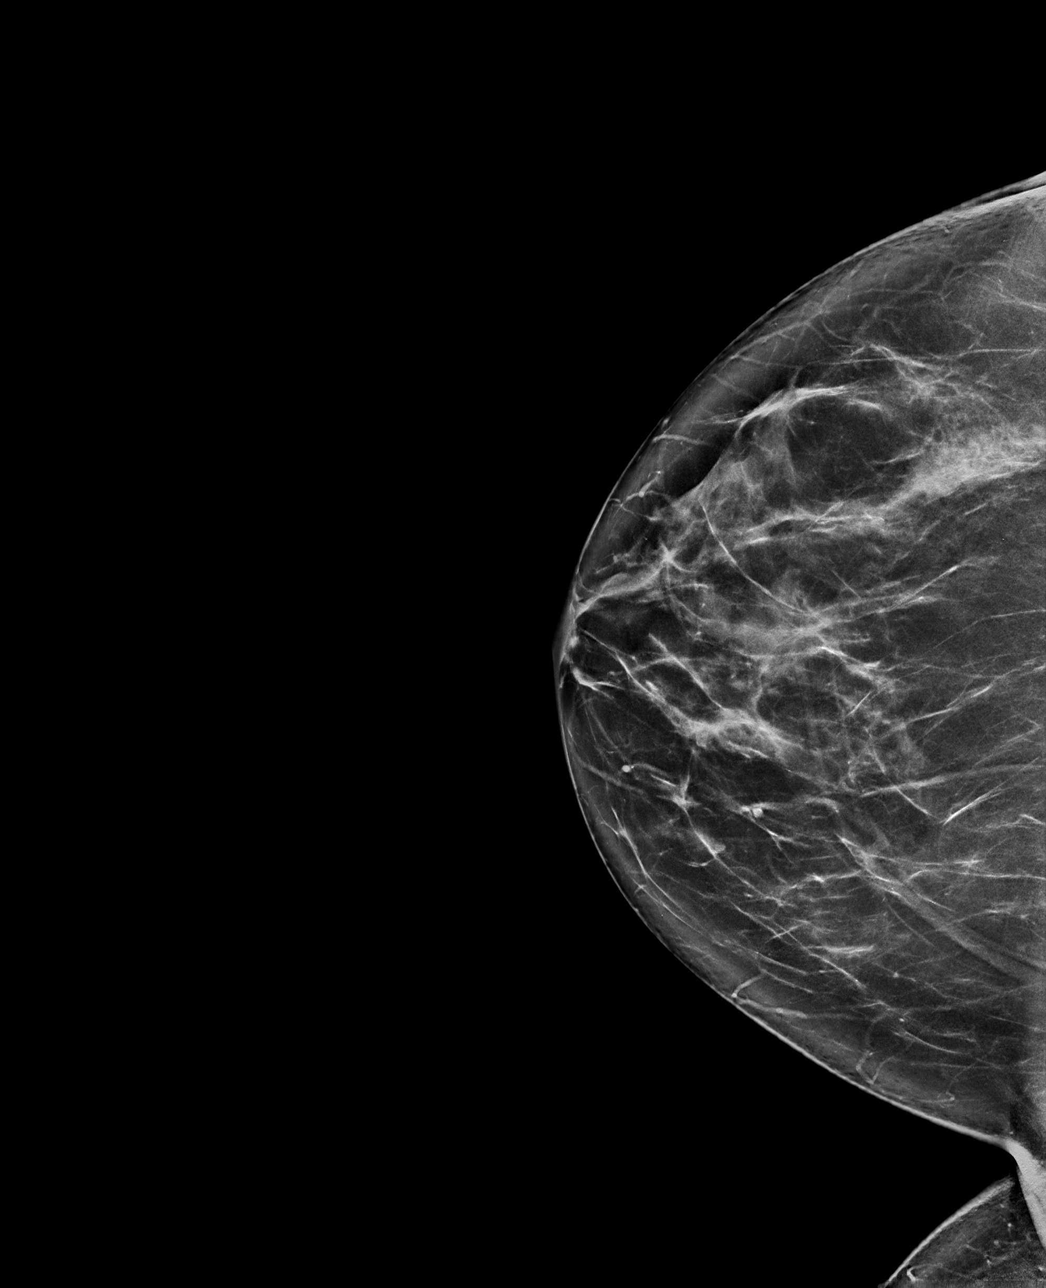

[R MLO synth-2D]
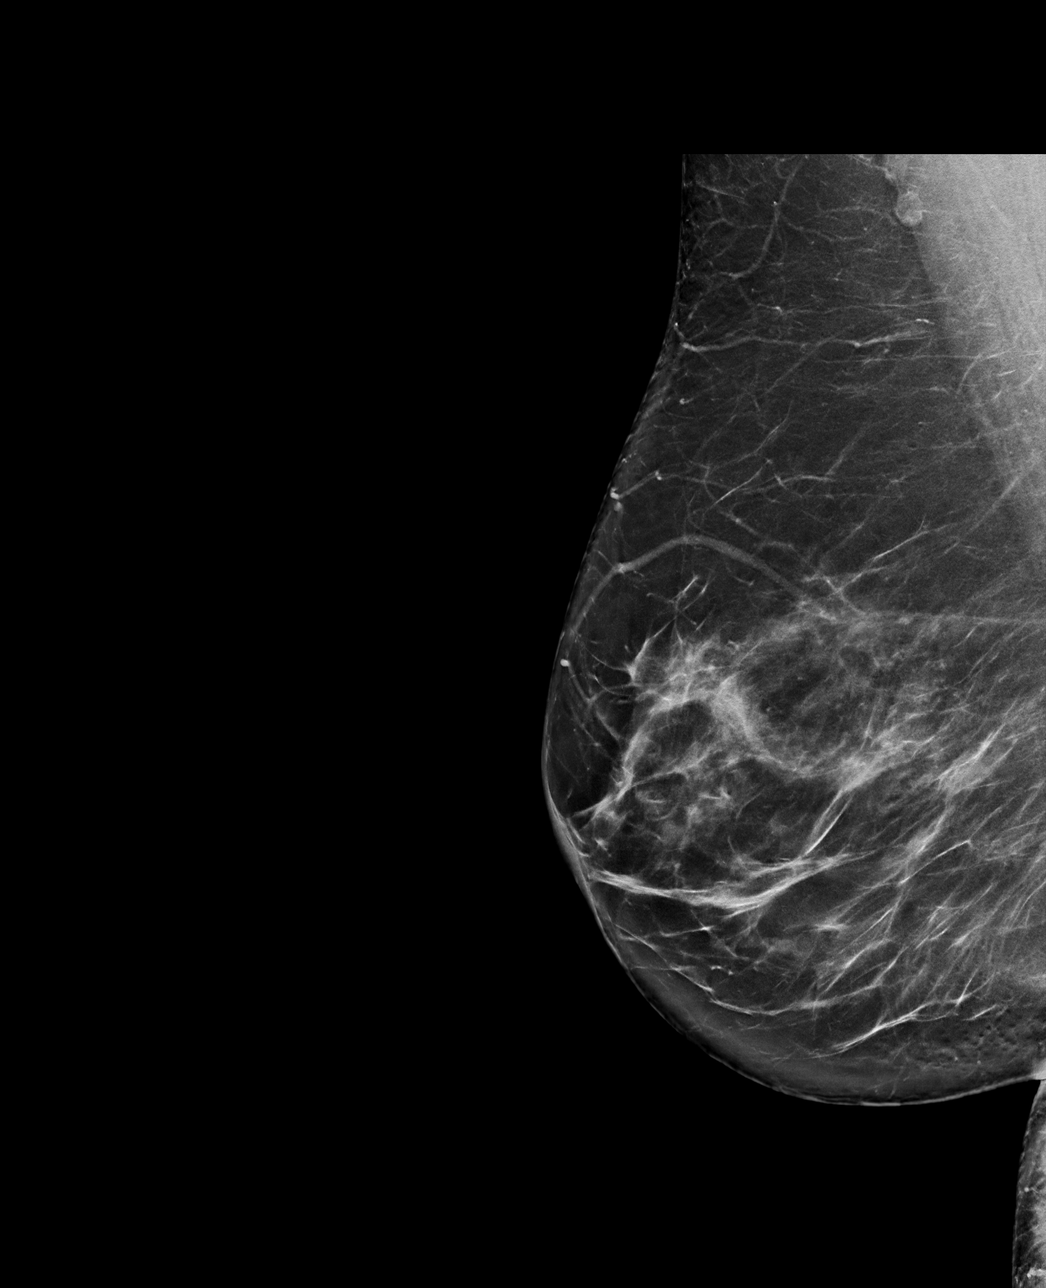

[R CC tomo · tomo slice 43/84.0]
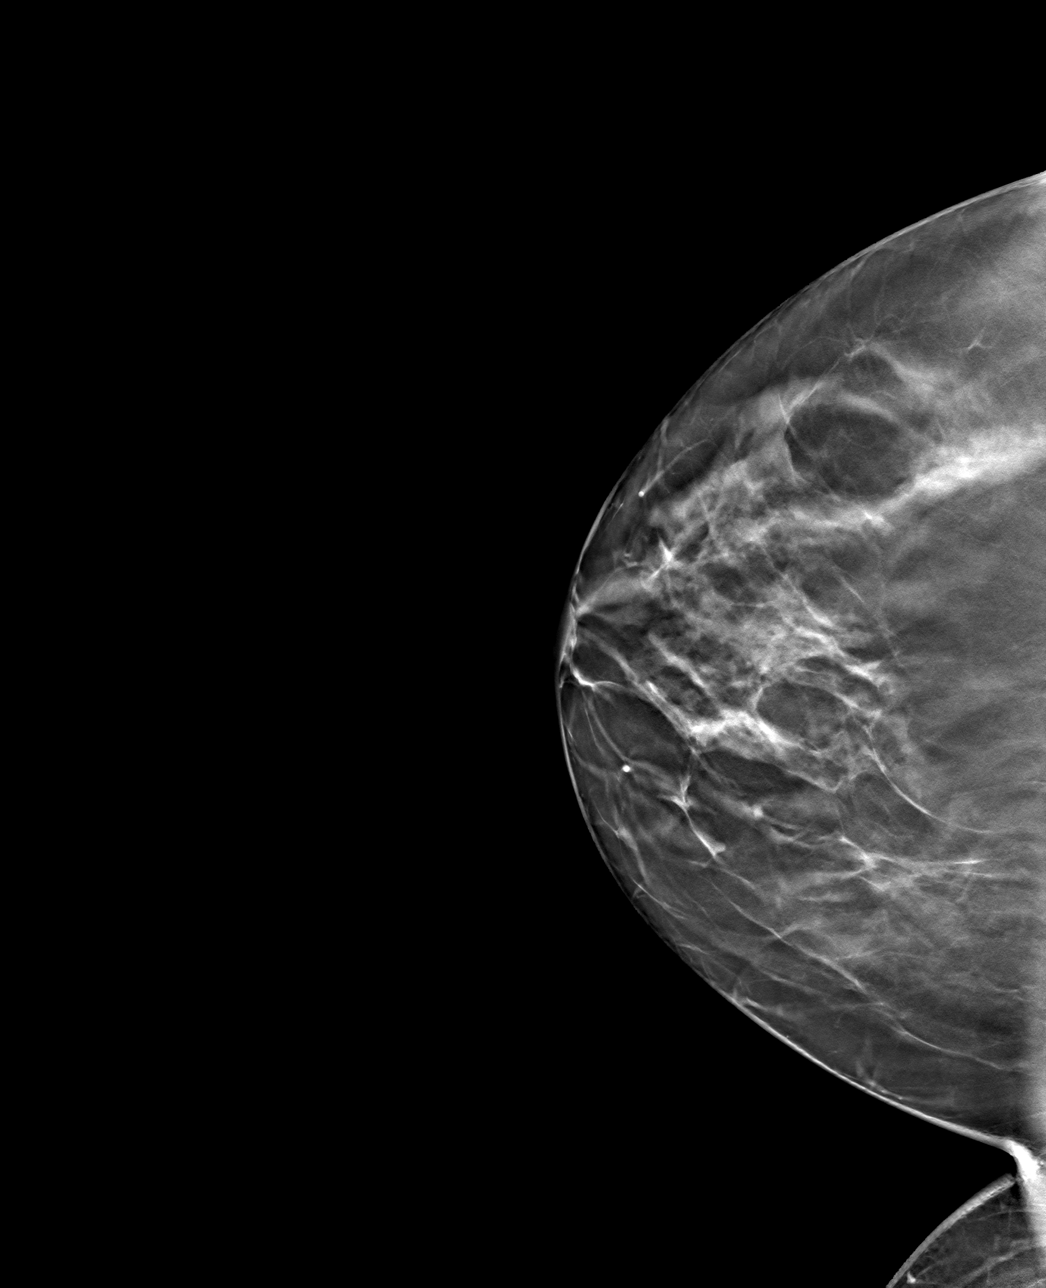

[R MLO tomo · tomo slice 43/86.0]
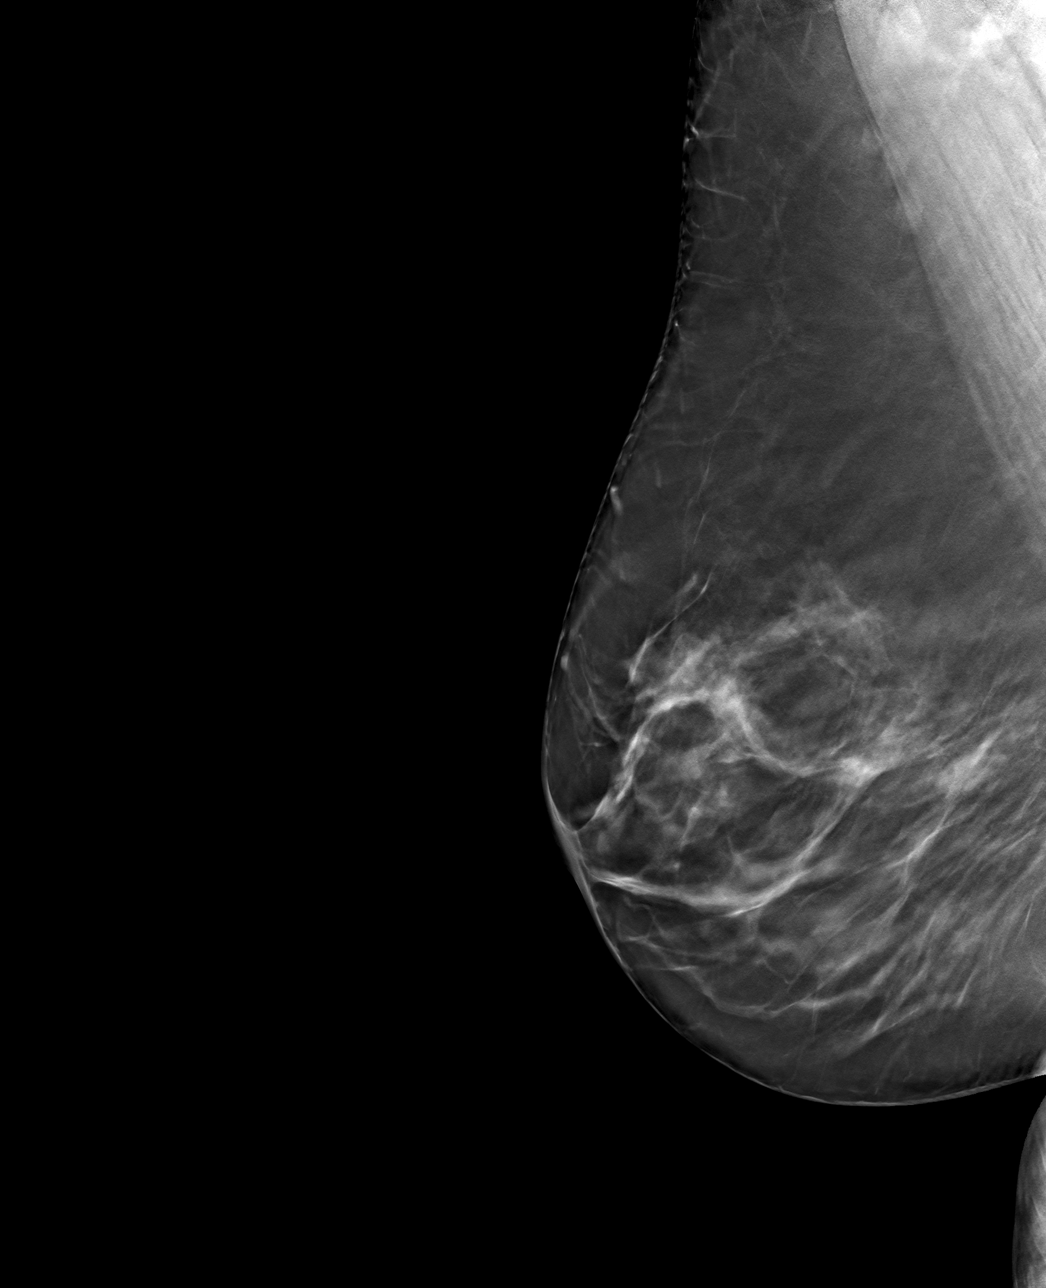

[4 of 12 positions shown; findings below may reference images not displayed]

ACR Breast Density Category c: The breast tissue is heterogeneously
dense, which may obscure small masses.
FINDINGS: No suspicious mass, distortion, or microcalcifications are
identified to suggest presence of malignancy.

Mammographic images were processed with CAD.
IMPRESSION: No mammographic evidence for malignancy

Breast pain is a common condition. It can be exacerbated by
caffeine, hormonal changes, and weight gain. It can be improved by
wearing adequate support, over-the-counter pain medication, low-fat
diet, exercise, and ice as needed. Often breast pain will resolve on
its own without intervention.

RECOMMENDATION:
Screening mammogram is recommended in May 2019.

I have discussed the findings and recommendations with the patient.
If applicable, a reminder letter will be sent to the patient
regarding the next appointment.

BI-RADS CATEGORY  1: Negative.

## 2020-05-27 DIAGNOSIS — B009 Herpesviral infection, unspecified: Secondary | ICD-10-CM | POA: Insufficient documentation

## 2020-07-02 ENCOUNTER — Encounter (HOSPITAL_BASED_OUTPATIENT_CLINIC_OR_DEPARTMENT_OTHER): Payer: Self-pay

## 2020-07-02 ENCOUNTER — Other Ambulatory Visit: Payer: Self-pay

## 2020-07-02 ENCOUNTER — Emergency Department (HOSPITAL_BASED_OUTPATIENT_CLINIC_OR_DEPARTMENT_OTHER)
Admission: EM | Admit: 2020-07-02 | Discharge: 2020-07-02 | Disposition: A | Discharge status: Home / Self Care | Payer: 59 | Attending: Emergency Medicine | Admitting: Emergency Medicine

## 2020-07-02 DIAGNOSIS — Z79899 Other long term (current) drug therapy: Secondary | ICD-10-CM | POA: Insufficient documentation

## 2020-07-02 DIAGNOSIS — N760 Acute vaginitis: Secondary | ICD-10-CM | POA: Insufficient documentation

## 2020-07-02 DIAGNOSIS — N939 Abnormal uterine and vaginal bleeding, unspecified: Secondary | ICD-10-CM | POA: Diagnosis present

## 2020-07-02 DIAGNOSIS — Z7982 Long term (current) use of aspirin: Secondary | ICD-10-CM | POA: Diagnosis not present

## 2020-07-02 DIAGNOSIS — N3091 Cystitis, unspecified with hematuria: Secondary | ICD-10-CM | POA: Diagnosis not present

## 2020-07-02 DIAGNOSIS — B9689 Other specified bacterial agents as the cause of diseases classified elsewhere: Secondary | ICD-10-CM | POA: Diagnosis not present

## 2020-07-02 DIAGNOSIS — I10 Essential (primary) hypertension: Secondary | ICD-10-CM | POA: Diagnosis not present

## 2020-07-02 LAB — WET PREP, GENITAL
Sperm: NONE SEEN
Trich, Wet Prep: NONE SEEN
Yeast Wet Prep HPF POC: NONE SEEN

## 2020-07-02 LAB — URINALYSIS, ROUTINE W REFLEX MICROSCOPIC
Bilirubin Urine: NEGATIVE
Glucose, UA: NEGATIVE mg/dL
Hgb urine dipstick: NEGATIVE
Ketones, ur: NEGATIVE mg/dL
Leukocytes,Ua: NEGATIVE
Nitrite: NEGATIVE
Protein, ur: NEGATIVE mg/dL
Specific Gravity, Urine: 1.026 (ref 1.005–1.030)
pH: 6.5 (ref 5.0–8.0)

## 2020-07-02 LAB — URINALYSIS, MICROSCOPIC (REFLEX): RBC / HPF: >50 RBC/hpf (ref 0–5)

## 2020-07-02 MED ORDER — SULFAMETHOXAZOLE-TRIMETHOPRIM 800-160 MG PO TABS
ORAL_TABLET | ORAL | Status: AC
  Filled 2020-07-02: qty 1

## 2020-07-02 MED ORDER — SULFAMETHOXAZOLE-TRIMETHOPRIM 800-160 MG PO TABS: 1.0000 | ORAL_TABLET | Freq: Two times a day (BID) | ORAL | 0 refills | Status: AC

## 2020-07-02 MED ORDER — METRONIDAZOLE 500 MG PO TABS: 500.0000 mg | ORAL_TABLET | Freq: Two times a day (BID) | ORAL | 0 refills | Status: DC

## 2020-07-02 MED ORDER — PHENAZOPYRIDINE HCL 200 MG PO TABS: 200.0000 mg | ORAL_TABLET | Freq: Three times a day (TID) | ORAL | 0 refills | Status: DC

## 2020-07-02 MED ORDER — METRONIDAZOLE 500 MG PO TABS
ORAL_TABLET | ORAL | Status: AC
  Filled 2020-07-02: qty 1

## 2020-07-02 MED ORDER — PHENAZOPYRIDINE HCL 100 MG PO TABS
ORAL_TABLET | ORAL | Status: AC
  Filled 2020-07-02: qty 2

## 2020-07-02 NOTE — ED Provider Notes (Signed)
[facility] EMERGENCY DEPT Provider Note   CSN: [id number] Arrival date & time: 07/02/20  0021     History Chief Complaint  Patient presents with   Vaginal Bleeding    [name] [name] is a 60 y.o. female.  Pt presents to the ED today with vaginal bleeding.  Pt said she developed painful urination around 2000.  She said she noticed some blood when she wiped.  The bleeding has worsened and she has some groin discomfort.  She has had a hysterectomy.  She is not on blood thinners.  Pt has a new sexual partner and has been having sex much more often than she is used to (husband died 3 yrs ago).       Past Medical History:  Diagnosis Date   Anemia    Anxiety    GAD (generalized anxiety disorder)    GERD (gastroesophageal reflux disease)    Grief reaction    Headache(784.0)    History of adenomatous polyp of colon    tubular adenoma 07-31-2015   HTN (hypertension)    Hypercholesterolemia    Hyperlipidemia    Iron deficiency    Left lower quadrant pain    chronic   Moderate recurrent major depression (HCC)    RLS (restless legs syndrome)    Sleep disturbance    Vitamin D deficiency    Wears glasses    White coat syndrome without hypertension     Patient Active Problem List   Diagnosis Date Noted   Restless leg syndrome 10/24/2012    Past Surgical History:  Procedure Laterality Date   ABDOMINAL HYSTERECTOMY     COLONOSCOPY  last one 07-31-2015   CYSTOSCOPY  12/12/2011   Procedure: CYSTOSCOPY;  Surgeon: Meriel Pica, MD;  Location: [facility];  Service: Gynecology;  Laterality: N/A;   DILATION AND CURETTAGE OF UTERUS  1988   LAPAROSCOPIC ASSISTED VAGINAL HYSTERECTOMY  12/29/2005   LAPAROSCOPIC SALPINGO OOPHERECTOMY Left 09/21/2015   Procedure: LAPAROSCOPIC SALPINGO OOPHORECTOMY,;  Surgeon: Richarda Overlie, MD;  Location: [facility];  Service: Gynecology;  Laterality: Left;   OPEN REDUCTION INTERNAL FIXATION (ORIF) DISTAL RADIAL FRACTURE  Left 10/27/2016   Procedure: OPEN REDUCTION INTERNAL FIXATION (ORIF) DISTAL RADIAL FRACTURE;  Surgeon: Betha Loa, MD;  Location: [facility];  Service: Orthopedics;  Laterality: Left;   PUBOVAGINAL SLING  12/12/2011   Procedure: Leonides Grills;  Surgeon: Meriel Pica, MD;  Location: [facility];  Service: Gynecology;  Laterality: N/A;  Solyx Single Incision Sling     OB History   No obstetric history on file.     Family History  Problem Relation Age of Onset   Heart attack Brother    Heart attack Father    Heart Problems Father     Social History   Tobacco Use   Smoking status: Never   Smokeless tobacco: Never  Substance Use Topics   Alcohol use: Yes    Comment: social   Drug use: No    Home Medications Prior to Admission medications   Medication Sig Start Date End Date Taking? Authorizing Provider  metroNIDAZOLE (FLAGYL) 500 MG tablet Take 1 tablet (500 mg total) by mouth 2 (two) times daily. 07/02/20  Yes Jacalyn Lefevre, MD  phenazopyridine (PYRIDIUM) 200 MG tablet Take 1 tablet (200 mg total) by mouth 3 (three) times daily. 07/02/20  Yes Jacalyn Lefevre, MD  sulfamethoxazole-trimethoprim (BACTRIM DS) 800-160 MG tablet Take 1 tablet by mouth 2 (two) times daily for 7 days. 07/02/20 07/09/20  Yes Jacalyn Lefevre, MD  aspirin EC 81 MG tablet Take 1 tablet (81 mg total) by mouth daily. 05/29/19   Lars Masson, MD  b complex vitamins tablet Take 1 tablet by mouth daily.    [provider]  buPROPion (WELLBUTRIN XL) 300 MG 24 hr tablet Take 300 mg by mouth every morning. 05/04/19   [provider]  calcium-vitamin D (OSCAL WITH D) 500-200 MG-UNIT per tablet Take 1 tablet by mouth daily.    [provider]  estradiol (VIVELLE-DOT) 0.05 MG/24HR patch APP 1 PATCH TO SKIN TWICE WEEKLY UTD 07/20/17   [provider]  ferrous sulfate 325 (65 FE) MG EC tablet Take 325 mg by mouth daily with breakfast.    [provider]   gabapentin (NEURONTIN) 300 MG capsule Take 1 capsule (300 mg total) by mouth at bedtime. 02/19/20   Lomax, Amy, NP  Garlic 1000 MG CAPS Take 1 capsule by mouth daily.    [provider]  ibuprofen (ADVIL,MOTRIN) 200 MG tablet Take 400-600 mg by mouth every 8 (eight) hours as needed. For headache    [provider]  LORazepam (ATIVAN) 0.5 MG tablet Take 0.5 mg by mouth every 8 (eight) hours as needed. Pt takes half tablet for anxiety.    [provider]  metoprolol succinate (TOPROL-XL) 25 MG 24 hr tablet Take 25 mg by mouth daily.    [provider]  pantoprazole (PROTONIX) 40 MG tablet Take 40 mg by mouth 2 (two) times daily.    [provider]  rosuvastatin (CRESTOR) 5 MG tablet Take 0.5 tablets (2.5 mg total) by mouth daily 5 times a week.  Take Mon-Fri. 10/15/19   Lars Masson, MD  vitamin C (ASCORBIC ACID) 500 MG tablet Take 500 mg by mouth daily.    [provider]  VITAMIN D, ERGOCALCIFEROL, PO Take 1 tablet by mouth daily.    [provider]  VITAMIN E PO Take 1 tablet by mouth daily.    [provider]    Allergies    Lipitor [atorvastatin], Lyrica [pregabalin], Penicillins, and Strawberry extract  Review of Systems   Review of Systems  Genitourinary:  Positive for dysuria and vaginal bleeding.  All other systems reviewed and are negative.  Physical Exam Updated Vital Signs BP (!) 175/89 (BP Location: Right Arm)   Pulse 94   Temp 98 F (36.7 C) (Oral)   Resp 18   Ht 5\' 5"  (1.651 m)   Wt 74.8 kg   SpO2 100%   BMI 27.46 kg/m   Physical Exam Vitals and nursing note reviewed. Exam conducted with a chaperone present.  Constitutional:      Appearance: Normal appearance.  HENT:     Head: Normocephalic and atraumatic.     Right Ear: External ear normal.     Left Ear: External ear normal.     Nose: Nose normal.     Mouth/Throat:     Mouth: Mucous membranes are moist.     Pharynx: Oropharynx is  clear.  Eyes:     Extraocular Movements: Extraocular movements intact.     Conjunctiva/sclera: Conjunctivae normal.     Pupils: Pupils are equal, round, and reactive to light.  Cardiovascular:     Rate and Rhythm: Normal rate and regular rhythm.     Pulses: Normal pulses.     Heart sounds: Normal heart sounds.  Pulmonary:     Effort: Pulmonary effort is normal.     Breath sounds:  Normal breath sounds.  Abdominal:     General: Abdomen is flat. Bowel sounds are normal.     Palpations: Abdomen is soft.     Tenderness: There is abdominal tenderness in the suprapubic area.  Genitourinary:    Exam position: Lithotomy position.     Comments: No blood in vagina.  Blood from urethra. Musculoskeletal:        General: Normal range of motion.     Cervical back: Normal range of motion and neck supple.  Skin:    General: Skin is warm.     Capillary Refill: Capillary refill takes less than 2 seconds.  Neurological:     General: No focal deficit present.     Mental Status: She is alert and oriented to person, place, and time.  Psychiatric:        Mood and Affect: Mood normal.        Behavior: Behavior normal.        Thought Content: Thought content normal.        Judgment: Judgment normal.    ED Results / Procedures / Treatments   Labs (all labs ordered are listed, but only abnormal results are displayed) Labs Reviewed  WET PREP, GENITAL  URINALYSIS, ROUTINE W REFLEX MICROSCOPIC  GC/CHLAMYDIA PROBE AMP ([place]) NOT AT [facility]    EKG None  Radiology No results found.  Procedures Procedures   Medications Ordered in ED Medications  sulfamethoxazole-trimethoprim (BACTRIM DS) 800-160 MG per tablet (  Given 07/02/20 0217)  phenazopyridine (PYRIDIUM) 100 MG tablet (  Given 07/02/20 0217)  metroNIDAZOLE (FLAGYL) 500 MG tablet (  Given 07/02/20 0217)    ED Course  I have reviewed the triage vital signs and the nursing notes.  Pertinent labs & imaging results that were available  during my care of the patient were reviewed by me and considered in my medical decision making (see chart for details).    MDM Rules/Calculators/A&P                          Pt started on bactrim/flagyl/pyridium.  Pt is to f/u with urology.  Return if worse. Final Clinical Impression(s) / ED Diagnoses Final diagnoses:  Bacterial vaginosis  Hemorrhagic cystitis    Rx / DC Orders ED Discharge Orders          Ordered    sulfamethoxazole-trimethoprim (BACTRIM DS) 800-160 MG tablet  2 times daily        07/02/20 0217    phenazopyridine (PYRIDIUM) 200 MG tablet  3 times daily        07/02/20 0217    metroNIDAZOLE (FLAGYL) 500 MG tablet  2 times daily        07/02/20 0217             Jacalyn Lefevre, MD 07/02/20 [phone]

## 2020-07-02 NOTE — ED Notes (Signed)
See paper charting for documentation for down time.

## 2020-07-02 NOTE — ED Triage Notes (Signed)
Pt c/o vaginal bleeding with urgency and pain. Pt states she thought she may have a UTI due to the urgency and when she urinated she had some burning and a small amount of blood when she wiped. Pt stated the bleeding has now increased and is darker in color.

## 2020-07-03 LAB — GC/CHLAMYDIA PROBE AMP (~~LOC~~) NOT AT ARMC
Chlamydia: NEGATIVE
Comment: NEGATIVE
Comment: NORMAL
Neisseria Gonorrhea: NEGATIVE

## 2020-12-22 ENCOUNTER — Ambulatory Visit: Payer: 59 | Admitting: Diagnostic Neuroimaging

## 2020-12-22 ENCOUNTER — Encounter: Payer: Self-pay | Admitting: Diagnostic Neuroimaging

## 2020-12-22 VITALS — BP 133/85 | HR 70 | Ht 65.0 in | Wt 165.0 lb

## 2020-12-22 DIAGNOSIS — G2581 Restless legs syndrome: Secondary | ICD-10-CM | POA: Diagnosis not present

## 2020-12-22 MED ORDER — ROPINIROLE HCL 0.25 MG PO TABS: 0.2500 mg | ORAL_TABLET | Freq: Every day | ORAL | 4 refills | Status: DC

## 2020-12-22 NOTE — Progress Notes (Signed)
[facility]  PATIENT: [name] [name] DOB: August 21, 1960  REFERRING CLINICIAN: Daisy Floro, MD HISTORY FROM: patient REASON FOR VISIT: revisit   HISTORICAL  CHIEF COMPLAINT:  Chief Complaint  Patient presents with   Follow-up    RM 7 Alone  Pt is well, RLS is under control, does have spells when she is on her feet a lot but overall stable     HISTORY OF PRESENT ILLNESS:   UPDATE (12/22/20, VRP): Since last visit, tried gabapentin, but didn't help RLS. Back on ropinirole and doing better. Compulsion / shopping behaviors from prior are now better controlled (patient thinks it was related to depression which is better now).   UPDATE (06/19/18, VRP): Since last visit, doing well. Symptoms are stable. Severity is mild. No alleviating or aggravating factors. Tolerating ropinirole now (neupro not covered by insurance anymore).    UPDATE (08/16/17, VRP): Since last visit, doing well with neupro patch. Symptoms are mild. Severity mild. No alleviating or aggravating factors. Tolerating neupro. Husband passed away in 2018-12-03and sleep is affected.    UPDATE 07/26/16: Since last visit, has strained right foot in Jan 2018. RLS stable on rotigotine patch. Taking iron supplements.    UPDATE 05/26/15: Since last visit, doing well. Tolerating rotigotine. Taking iron tabs.    UPDATE 05/26/14: Doing well. Has been working out at [employer]; doing much better with her exercise tolerance. She has gained strength and lost 25lbs. Overall RLS sxs better. Doing well on neupro patch.    UPDATE 01/14/14: Since last visit, stable with RLS. Recently tried walking in a 5k, but had to wait for 2 hours in the cold before hand, and then had sig pain during the walk, and she couldn't complete it.    UPDATE 07/10/13: Since last visit, doing well. Satisfied with RLS control (neupro patch, iron replacement). Separately notes more callous/bunion in bilateral feet.   UPDATE 05/10/13: Since last visit  patient reduced neupro patch to 1 mg daily. Since then swelling in legs has significantly improved. Patient was tested for iron deficiency and was found to be borderline low. She started iron 325 mg tablet, but had side effects of numbness and tingling and palpitations. She stopped iron tablet and the symptoms resolved. However the iron tablets did seem to improve her restless leg symptoms. Patient is planning to modify her diet to increase iron levels as well as try a low-dose iron supplement.   UPDATE 04/08/13 (VP): Since last visit, tried neupro 2mg  patch with some benefit in RLS. 2-3 weeks ago, stopped her iron supplement, because she was unsure about duration of therapy. Last Sunday, developed mild, gradual swelling in feet, ankles, fingers. No CP, SOB. Also with intermittent pain in legs with exertion/walking, and continue RLS symptoms at rest.   UPDATE 12/24/12 (LL):  Patient states Neupro 1 mg is working. Since the patch is working, she has been more active. She is very happy she is able to be more active. She has noticed her legs are hurting more at night with increased activity. Requesting to know if Neupro patch dosage should be increased.    PRIOR HPI 10/24/12 (VP):  60 year old right-handed female here for evaluation of leg pain. In patient reports pain in her legs, throughout, aching and sore feeling, which is worse when she is walking. Spent or she sits down. However later in the evening she feels restless and has the urge to move her legs. In the morning when she wakes up her legs are  quite stiff. Symptoms are going on for past 6-8 months. Patient tried Lyrica 50 mg at bedtime, tried 100 mg dose but could not tolerate it. Patient was diagnosed with low iron levels and is on replacement for past one month.       REVIEW OF SYSTEMS: Full 14 system review of systems performed and negative with exception of: as per HPI.  ALLERGIES: Allergies  Allergen Reactions   Lipitor [Atorvastatin] Other  (See Comments)    Leg pain   Lyrica [Pregabalin] Other (See Comments)    Leg pain   Penicillins Other (See Comments)    Unknown Childhood reaction   Strawberry Extract Hives    HOME MEDICATIONS: Outpatient Encounter Medications as of 12/22/2020  Medication Sig   aspirin EC 81 MG tablet Take 1 tablet (81 mg total) by mouth daily.   b complex vitamins tablet Take 1 tablet by mouth daily.   calcium-vitamin D (OSCAL WITH D) 500-200 MG-UNIT per tablet Take 1 tablet by mouth daily.   estradiol (VIVELLE-DOT) 0.05 MG/24HR patch APP 1 PATCH TO SKIN TWICE WEEKLY UTD   ferrous sulfate 325 (65 FE) MG EC tablet Take 325 mg by mouth daily with breakfast.   FLUoxetine (PROZAC) 20 MG tablet Take 20 mg by mouth 3 (three) times a week.   Garlic 1000 MG CAPS Take 1 capsule by mouth daily.   ibuprofen (ADVIL,MOTRIN) 200 MG tablet Take 400-600 mg by mouth every 8 (eight) hours as needed. For headache   LORazepam (ATIVAN) 0.5 MG tablet Take 0.5 mg by mouth every 8 (eight) hours as needed. Pt takes half tablet for anxiety.   metoprolol succinate (TOPROL-XL) 25 MG 24 hr tablet Take 25 mg by mouth daily.   metroNIDAZOLE (FLAGYL) 500 MG tablet Take 1 tablet (500 mg total) by mouth 2 (two) times daily.   pantoprazole (PROTONIX) 40 MG tablet Take 40 mg by mouth 2 (two) times daily.   phenazopyridine (PYRIDIUM) 200 MG tablet Take 1 tablet (200 mg total) by mouth 3 (three) times daily.   rosuvastatin (CRESTOR) 5 MG tablet Take 0.5 tablets (2.5 mg total) by mouth daily 5 times a week.  Take Mon-Fri.   valACYclovir (VALTREX) 500 MG tablet Take 500 mg by mouth daily.   vitamin C (ASCORBIC ACID) 500 MG tablet Take 500 mg by mouth daily.   VITAMIN D, ERGOCALCIFEROL, PO Take 1 tablet by mouth daily.   VITAMIN E PO Take 1 tablet by mouth daily.   [DISCONTINUED] rOPINIRole (REQUIP) 0.25 MG tablet Take 0.25 mg by mouth at bedtime.   rOPINIRole (REQUIP) 0.25 MG tablet Take 1 tablet (0.25 mg total) by mouth at bedtime.    [DISCONTINUED] buPROPion (WELLBUTRIN XL) 300 MG 24 hr tablet Take 300 mg by mouth every morning.   [DISCONTINUED] gabapentin (NEURONTIN) 300 MG capsule Take 1 capsule (300 mg total) by mouth at bedtime. (Patient not taking: Reported on 12/22/2020)   No facility-administered encounter medications on file as of 12/22/2020.        PHYSICAL EXAM  GENERAL EXAM/CONSTITUTIONAL: Vitals:  Vitals:   12/22/20 0932  BP: 133/85  Pulse: 70  Weight: 165 lb (74.8 kg)  Height: 5\' 5"  (1.651 m)   Body mass index is 27.46 kg/m. Wt Readings from Last 3 Encounters:  12/22/20 165 lb (74.8 kg)  07/02/20 165 lb (74.8 kg)  12/23/19 164 lb (74.4 kg)   Patient is in no distress; well developed, nourished and groomed; neck is supple  CARDIOVASCULAR: Examination of carotid arteries is normal;  no carotid bruits Regular rate and rhythm, no murmurs Examination of peripheral vascular system by observation and palpation is normal  EYES: Ophthalmoscopic exam of optic discs and posterior segments is normal; no papilledema or hemorrhages No results found.  MUSCULOSKELETAL: Gait, strength, tone, movements noted in Neurologic exam below  NEUROLOGIC: MENTAL STATUS:  No flowsheet data found. awake, alert, oriented to person, place and time recent and remote memory intact normal attention and concentration language fluent, comprehension intact, naming intact fund of knowledge appropriate  CRANIAL NERVE:  2nd - no papilledema on fundoscopic exam 2nd, 3rd, 4th, 6th - pupils equal and reactive to light, visual fields full to confrontation, extraocular muscles intact, no nystagmus 5th - facial sensation symmetric 7th - facial strength symmetric 8th - hearing intact 9th - palate elevates symmetrically, uvula midline 11th - shoulder shrug symmetric 12th - tongue protrusion midline  MOTOR:  normal bulk and tone, full strength in the BUE, BLE  SENSORY:  normal and symmetric to light  touch  COORDINATION:  finger-nose-finger, fine finger movements normal  REFLEXES:  deep tendon reflexes present and symmetric  GAIT/STATION:  narrow based gait     DIAGNOSTIC DATA (LABS, IMAGING, TESTING) - I reviewed patient records, labs, notes, testing and imaging myself where available.  Lab Results  Component Value Date   WBC 9.8 09/17/2015   HGB 13.0 09/17/2015   HCT 39.0 09/17/2015   MCV 91.3 09/17/2015   PLT 292 09/17/2015   No results found for: NA, K, CL, CO2, GLUCOSE, BUN, CREATININE, CALCIUM, PROT, ALBUMIN, AST, ALT, ALKPHOS, BILITOT, GFRNONAA, GFRAA No results found for: CHOL, HDL, LDLCALC, LDLDIRECT, TRIG, CHOLHDL No results found for: ZHQU0Q No results found for: VITAMINB12 No results found for: TSH    ASSESSMENT AND PLAN  60 y.o. year old female here with:  Meds tried: neupro, gabapentin  Dx:  1. Restless leg syndrome      PLAN:  RLS - continue ropirirole 0.25mg  at bedtime - continue iron supplements - mental alerting activities, such as working on a computer or doing crossword puzzles, at times of rest or boredom - moderate regular exercise - avoid caffeine and alcohol - for symptomatic relief - walking, bicycling, soaking the affected limbs, and leg massage, including pneumatic compression  Meds ordered this encounter  Medications   rOPINIRole (REQUIP) 0.25 MG tablet    Sig: Take 1 tablet (0.25 mg total) by mouth at bedtime.    Dispense:  90 tablet    Refill:  4   Return in about 15 months (around 03/24/2022) for MyChart visit (15 min).    Suanne Marker, MD 12/22/2020, 10:02 AM Certified in Neurology, Neurophysiology and Neuroimaging  [facility] [address] [place], [place] [address] [phone]

## 2020-12-22 NOTE — Patient Instructions (Signed)
RLS - continue ropirirole 0.25mg  at bedtime - continue iron supplements - mental alerting activities, such as working on a computer or doing crossword puzzles, at times of rest or boredom - moderate regular exercise - avoid caffeine and alcohol - for symptomatic relief - walking, bicycling, soaking the affected limbs, and leg massage, including pneumatic compression

## 2021-02-05 DIAGNOSIS — B351 Tinea unguium: Secondary | ICD-10-CM | POA: Diagnosis not present

## 2021-02-05 DIAGNOSIS — L600 Ingrowing nail: Secondary | ICD-10-CM | POA: Diagnosis not present

## 2021-02-16 ENCOUNTER — Ambulatory Visit: Payer: Self-pay | Admitting: Podiatry

## 2021-02-26 ENCOUNTER — Other Ambulatory Visit: Payer: Self-pay

## 2021-02-26 ENCOUNTER — Encounter: Payer: Self-pay | Admitting: Podiatry

## 2021-02-26 ENCOUNTER — Ambulatory Visit: Payer: 59 | Admitting: Podiatry

## 2021-02-26 DIAGNOSIS — F411 Generalized anxiety disorder: Secondary | ICD-10-CM | POA: Insufficient documentation

## 2021-02-26 DIAGNOSIS — M8580 Other specified disorders of bone density and structure, unspecified site: Secondary | ICD-10-CM | POA: Insufficient documentation

## 2021-02-26 DIAGNOSIS — Z860100 Personal history of colon polyps, unspecified: Secondary | ICD-10-CM | POA: Insufficient documentation

## 2021-02-26 DIAGNOSIS — F331 Major depressive disorder, recurrent, moderate: Secondary | ICD-10-CM | POA: Insufficient documentation

## 2021-02-26 DIAGNOSIS — G479 Sleep disorder, unspecified: Secondary | ICD-10-CM | POA: Insufficient documentation

## 2021-02-26 DIAGNOSIS — E7800 Pure hypercholesterolemia, unspecified: Secondary | ICD-10-CM | POA: Insufficient documentation

## 2021-02-26 DIAGNOSIS — L600 Ingrowing nail: Secondary | ICD-10-CM

## 2021-02-26 DIAGNOSIS — F419 Anxiety disorder, unspecified: Secondary | ICD-10-CM | POA: Insufficient documentation

## 2021-02-26 DIAGNOSIS — H9190 Unspecified hearing loss, unspecified ear: Secondary | ICD-10-CM | POA: Insufficient documentation

## 2021-02-26 DIAGNOSIS — K219 Gastro-esophageal reflux disease without esophagitis: Secondary | ICD-10-CM | POA: Insufficient documentation

## 2021-02-26 DIAGNOSIS — F4321 Adjustment disorder with depressed mood: Secondary | ICD-10-CM | POA: Insufficient documentation

## 2021-02-26 DIAGNOSIS — E785 Hyperlipidemia, unspecified: Secondary | ICD-10-CM | POA: Insufficient documentation

## 2021-02-26 DIAGNOSIS — I10 Essential (primary) hypertension: Secondary | ICD-10-CM | POA: Insufficient documentation

## 2021-02-26 DIAGNOSIS — E559 Vitamin D deficiency, unspecified: Secondary | ICD-10-CM | POA: Insufficient documentation

## 2021-02-26 DIAGNOSIS — B351 Tinea unguium: Secondary | ICD-10-CM

## 2021-02-26 NOTE — Progress Notes (Signed)
°  Subjective:  Patient ID: [name] [name], female    DOB: 1961-01-18,   MRN: [id number]  Chief Complaint  Patient presents with   Ingrown Toenail      np// L great ingrown    61 y.o. female presents for concern of left great toenail fungus and ingrown nail. Patient relates she was seen by PCP and given topical antifungal (penlac) She has been applying everyday and getting better. She does have concern for ingrown when the nail grows out she gets a lot of pain. She is going on a trip to [place] at the end of March however and concerned about doing a procedure today . Denies any other pedal complaints. Denies n/v/f/c.   Past Medical History:  Diagnosis Date   Anemia    Anxiety    GAD (generalized anxiety disorder)    GERD (gastroesophageal reflux disease)    Grief reaction    Headache(784.0)    History of adenomatous polyp of colon    tubular adenoma 07-31-2015   HTN (hypertension)    Hypercholesterolemia    Hyperlipidemia    Iron deficiency    Left lower quadrant pain    chronic   Moderate recurrent major depression (HCC)    RLS (restless legs syndrome)    Sleep disturbance    Vitamin D deficiency    Wears glasses    White coat syndrome without hypertension     Objective:  Physical Exam: Vascular: DP/PT pulses 2/4 bilateral. CFT <3 seconds. Normal hair growth on digits. No edema.  Skin. No lacerations or abrasions bilateral feet. Left hallux nail thickened and discolored with subungual debris. Incurvation noted to lateral border. No erythema or edema minimal tenderness.  Musculoskeletal: MMT 5/5 bilateral lower extremities in DF, PF, Inversion and Eversion. Deceased ROM in DF of ankle joint.  Neurological: Sensation intact to light touch.   Assessment:   1. Onychomycosis   2. Ingrown nail of great toe of left foot      Plan:  Patient was evaluated and treated and all questions answered. -Examined patient -Discussed treatment options for painful dystrophic nails   -Discussed fungal nail treatment options including oral, topical, and laser treatments.  -continue with the topical for now and trimming nails back regularly.  -She is going on a trip to [place] and walking the [place] in March. She would like to do the procedure for ingrown nail after this trip because she is worried about healing.  -Patient to return mid April for ingrown nail procedure of left lateral border great toe.    Louann Sjogren, DPM

## 2021-03-23 DIAGNOSIS — Z86010 Personal history of colonic polyps: Secondary | ICD-10-CM | POA: Diagnosis not present

## 2021-03-23 DIAGNOSIS — K5730 Diverticulosis of large intestine without perforation or abscess without bleeding: Secondary | ICD-10-CM | POA: Diagnosis not present

## 2021-03-23 DIAGNOSIS — D175 Benign lipomatous neoplasm of intra-abdominal organs: Secondary | ICD-10-CM | POA: Diagnosis not present

## 2021-05-14 ENCOUNTER — Ambulatory Visit: Payer: 59 | Admitting: Podiatry

## 2021-05-14 ENCOUNTER — Encounter: Payer: Self-pay | Admitting: Podiatry

## 2021-05-14 DIAGNOSIS — B351 Tinea unguium: Secondary | ICD-10-CM | POA: Diagnosis not present

## 2021-05-14 DIAGNOSIS — L600 Ingrowing nail: Secondary | ICD-10-CM

## 2021-05-14 NOTE — Progress Notes (Signed)
?  Subjective:  ?Patient ID: [name] [name], female    DOB: 1960-06-23,   MRN: [id number] ? ?Chief Complaint  ?Patient presents with  ? Nail Problem  ?   ?ingrown nail procedure / Patient also states nail fungus is better since the last visit   ? ? ?61 y.o. female presents for follow-up of left great toenail fungus. She has been penlac applying everyday and getting better. Relates ingrown is doing ok no pain or discharge. Did well on the [place] for her trip in march without any pain.  Denies any other pedal complaints. Denies n/v/f/c.  ? ?Past Medical History:  ?Diagnosis Date  ? Anemia   ? Anxiety   ? GAD (generalized anxiety disorder)   ? GERD (gastroesophageal reflux disease)   ? Grief reaction   ? Headache(784.0)   ? History of adenomatous polyp of colon   ? tubular adenoma 07-31-2015  ? HTN (hypertension)   ? Hypercholesterolemia   ? Hyperlipidemia   ? Iron deficiency   ? Left lower quadrant pain   ? chronic  ? Moderate recurrent major depression (HCC)   ? RLS (restless legs syndrome)   ? Sleep disturbance   ? Vitamin D deficiency   ? Wears glasses   ? White coat syndrome without hypertension   ? ? ?Objective:  ?Physical Exam: ?Vascular: DP/PT pulses 2/4 bilateral. CFT <3 seconds. Normal hair growth on digits. No edema.  ?Skin. No lacerations or abrasions bilateral feet. Left hallux nail thickened and discolored to 3/4 of the proximal nail mostly lateral with subungual debris. Incurvation noted to lateral border. No erythema or edema minimal tenderness.  ?Musculoskeletal: MMT 5/5 bilateral lower extremities in DF, PF, Inversion and Eversion. Deceased ROM in DF of ankle joint.  ?Neurological: Sensation intact to light touch.  ? ?Assessment:  ? ?1. Onychomycosis   ?2. Ingrown nail of great toe of left foot   ? ? ? ? ?Plan:  ?Patient was evaluated and treated and all questions answered. ?-Examined patient ?-Discussed treatment options for painful dystrophic nails  ?-Discussed fungal nail treatment options including  oral, topical, and laser treatments.  ?-continue with the topical for now and trimming nails back regularly.  ?-Not currently having pain. If anything changes she will return to discuss ingrown nail procedure partial vs total avulsion.   ? ? ? ?Louann Sjogren, DPM  ? ? ?

## 2021-08-05 DIAGNOSIS — Z79890 Hormone replacement therapy (postmenopausal): Secondary | ICD-10-CM | POA: Diagnosis not present

## 2021-08-05 DIAGNOSIS — Z1231 Encounter for screening mammogram for malignant neoplasm of breast: Secondary | ICD-10-CM | POA: Diagnosis not present

## 2021-08-05 DIAGNOSIS — Z6828 Body mass index (BMI) 28.0-28.9, adult: Secondary | ICD-10-CM | POA: Diagnosis not present

## 2021-08-05 DIAGNOSIS — Z01419 Encounter for gynecological examination (general) (routine) without abnormal findings: Secondary | ICD-10-CM | POA: Diagnosis not present

## 2021-09-30 DIAGNOSIS — Z23 Encounter for immunization: Secondary | ICD-10-CM | POA: Diagnosis not present

## 2021-09-30 DIAGNOSIS — S43422A Sprain of left rotator cuff capsule, initial encounter: Secondary | ICD-10-CM | POA: Diagnosis not present

## 2021-09-30 DIAGNOSIS — Z6829 Body mass index (BMI) 29.0-29.9, adult: Secondary | ICD-10-CM | POA: Diagnosis not present

## 2021-10-19 DIAGNOSIS — S43422D Sprain of left rotator cuff capsule, subsequent encounter: Secondary | ICD-10-CM | POA: Diagnosis not present

## 2021-11-04 DIAGNOSIS — I10 Essential (primary) hypertension: Secondary | ICD-10-CM | POA: Diagnosis not present

## 2021-11-04 DIAGNOSIS — Z862 Personal history of diseases of the blood and blood-forming organs and certain disorders involving the immune mechanism: Secondary | ICD-10-CM | POA: Diagnosis not present

## 2021-11-04 DIAGNOSIS — E7800 Pure hypercholesterolemia, unspecified: Secondary | ICD-10-CM | POA: Diagnosis not present

## 2021-11-04 DIAGNOSIS — Z79899 Other long term (current) drug therapy: Secondary | ICD-10-CM | POA: Diagnosis not present

## 2021-11-16 DIAGNOSIS — Z6830 Body mass index (BMI) 30.0-30.9, adult: Secondary | ICD-10-CM | POA: Diagnosis not present

## 2021-11-16 DIAGNOSIS — H9041 Sensorineural hearing loss, unilateral, right ear, with unrestricted hearing on the contralateral side: Secondary | ICD-10-CM | POA: Diagnosis not present

## 2021-11-16 DIAGNOSIS — E7800 Pure hypercholesterolemia, unspecified: Secondary | ICD-10-CM | POA: Diagnosis not present

## 2021-11-23 DIAGNOSIS — Z13820 Encounter for screening for osteoporosis: Secondary | ICD-10-CM | POA: Diagnosis not present

## 2021-11-23 DIAGNOSIS — S43422D Sprain of left rotator cuff capsule, subsequent encounter: Secondary | ICD-10-CM | POA: Diagnosis not present

## 2021-11-24 DIAGNOSIS — R69 Illness, unspecified: Secondary | ICD-10-CM | POA: Diagnosis not present

## 2021-11-26 DIAGNOSIS — E7800 Pure hypercholesterolemia, unspecified: Secondary | ICD-10-CM | POA: Diagnosis not present

## 2021-11-26 DIAGNOSIS — B351 Tinea unguium: Secondary | ICD-10-CM | POA: Diagnosis not present

## 2021-11-26 DIAGNOSIS — Z6830 Body mass index (BMI) 30.0-30.9, adult: Secondary | ICD-10-CM | POA: Diagnosis not present

## 2021-11-26 DIAGNOSIS — R109 Unspecified abdominal pain: Secondary | ICD-10-CM | POA: Diagnosis not present

## 2021-11-26 DIAGNOSIS — K219 Gastro-esophageal reflux disease without esophagitis: Secondary | ICD-10-CM | POA: Diagnosis not present

## 2021-11-29 ENCOUNTER — Ambulatory Visit
Admission: RE | Admit: 2021-11-29 | Discharge: 2021-11-29 | Disposition: A | Discharge status: Home / Self Care | Payer: 59 | Source: Ambulatory Visit | Attending: Family Medicine | Admitting: Family Medicine

## 2021-11-29 ENCOUNTER — Other Ambulatory Visit: Payer: Self-pay | Admitting: Family Medicine

## 2021-11-29 DIAGNOSIS — R1032 Left lower quadrant pain: Secondary | ICD-10-CM | POA: Diagnosis not present

## 2021-11-29 DIAGNOSIS — R109 Unspecified abdominal pain: Secondary | ICD-10-CM | POA: Diagnosis not present

## 2021-11-29 DIAGNOSIS — I10 Essential (primary) hypertension: Secondary | ICD-10-CM | POA: Diagnosis not present

## 2021-11-29 DIAGNOSIS — G8929 Other chronic pain: Secondary | ICD-10-CM

## 2021-11-29 DIAGNOSIS — R1031 Right lower quadrant pain: Secondary | ICD-10-CM | POA: Diagnosis not present

## 2021-11-29 DIAGNOSIS — Q8909 Congenital malformations of spleen: Secondary | ICD-10-CM | POA: Diagnosis not present

## 2021-11-29 DIAGNOSIS — Z6829 Body mass index (BMI) 29.0-29.9, adult: Secondary | ICD-10-CM | POA: Diagnosis not present

## 2021-11-29 DIAGNOSIS — R110 Nausea: Secondary | ICD-10-CM | POA: Diagnosis not present

## 2021-11-29 MED ORDER — IOPAMIDOL (ISOVUE-300) INJECTION 61%
75.0000 mL | Freq: Once | INTRAVENOUS | Status: AC | PRN
  Administered 2021-11-29: 75 mL via INTRAVENOUS

## 2021-12-15 DIAGNOSIS — R69 Illness, unspecified: Secondary | ICD-10-CM | POA: Diagnosis not present

## 2021-12-21 DIAGNOSIS — S43422D Sprain of left rotator cuff capsule, subsequent encounter: Secondary | ICD-10-CM | POA: Diagnosis not present

## 2021-12-23 DIAGNOSIS — S43422D Sprain of left rotator cuff capsule, subsequent encounter: Secondary | ICD-10-CM | POA: Diagnosis not present

## 2021-12-24 DIAGNOSIS — R69 Illness, unspecified: Secondary | ICD-10-CM | POA: Diagnosis not present

## 2021-12-29 DIAGNOSIS — R69 Illness, unspecified: Secondary | ICD-10-CM | POA: Diagnosis not present

## 2021-12-31 DIAGNOSIS — R69 Illness, unspecified: Secondary | ICD-10-CM | POA: Diagnosis not present

## 2022-01-06 DIAGNOSIS — R69 Illness, unspecified: Secondary | ICD-10-CM | POA: Diagnosis not present

## 2022-01-12 DIAGNOSIS — R69 Illness, unspecified: Secondary | ICD-10-CM | POA: Diagnosis not present

## 2022-01-12 DIAGNOSIS — Z79899 Other long term (current) drug therapy: Secondary | ICD-10-CM | POA: Diagnosis not present

## 2022-01-12 DIAGNOSIS — E7800 Pure hypercholesterolemia, unspecified: Secondary | ICD-10-CM | POA: Diagnosis not present

## 2022-01-13 DIAGNOSIS — Z6830 Body mass index (BMI) 30.0-30.9, adult: Secondary | ICD-10-CM | POA: Diagnosis not present

## 2022-01-13 DIAGNOSIS — R69 Illness, unspecified: Secondary | ICD-10-CM | POA: Diagnosis not present

## 2022-01-17 ENCOUNTER — Other Ambulatory Visit: Payer: Self-pay | Admitting: Diagnostic Neuroimaging

## 2022-01-18 ENCOUNTER — Telehealth: Payer: Self-pay

## 2022-01-18 NOTE — Telephone Encounter (Signed)
Pt needs to be called for Follow-Up Appointment Please

## 2022-01-18 NOTE — Telephone Encounter (Signed)
Pt has been scheduled ,and has agreed to be on wait list as well.

## 2022-01-26 DIAGNOSIS — R69 Illness, unspecified: Secondary | ICD-10-CM | POA: Diagnosis not present

## 2022-02-02 DIAGNOSIS — R69 Illness, unspecified: Secondary | ICD-10-CM | POA: Diagnosis not present

## 2022-02-03 DIAGNOSIS — F419 Anxiety disorder, unspecified: Secondary | ICD-10-CM | POA: Diagnosis not present

## 2022-02-03 DIAGNOSIS — R002 Palpitations: Secondary | ICD-10-CM | POA: Diagnosis not present

## 2022-02-03 DIAGNOSIS — Z6830 Body mass index (BMI) 30.0-30.9, adult: Secondary | ICD-10-CM | POA: Diagnosis not present

## 2022-02-03 DIAGNOSIS — I10 Essential (primary) hypertension: Secondary | ICD-10-CM | POA: Diagnosis not present

## 2022-02-03 DIAGNOSIS — R69 Illness, unspecified: Secondary | ICD-10-CM | POA: Diagnosis not present

## 2022-02-09 DIAGNOSIS — F4320 Adjustment disorder, unspecified: Secondary | ICD-10-CM | POA: Diagnosis not present

## 2022-02-24 DIAGNOSIS — R69 Illness, unspecified: Secondary | ICD-10-CM | POA: Diagnosis not present

## 2022-02-24 DIAGNOSIS — I10 Essential (primary) hypertension: Secondary | ICD-10-CM | POA: Diagnosis not present

## 2022-02-24 DIAGNOSIS — F411 Generalized anxiety disorder: Secondary | ICD-10-CM | POA: Diagnosis not present

## 2022-02-24 DIAGNOSIS — G479 Sleep disorder, unspecified: Secondary | ICD-10-CM | POA: Diagnosis not present

## 2022-03-18 DIAGNOSIS — J040 Acute laryngitis: Secondary | ICD-10-CM | POA: Diagnosis not present

## 2022-03-18 DIAGNOSIS — R051 Acute cough: Secondary | ICD-10-CM | POA: Diagnosis not present

## 2022-03-18 DIAGNOSIS — Z6829 Body mass index (BMI) 29.0-29.9, adult: Secondary | ICD-10-CM | POA: Diagnosis not present

## 2022-03-18 DIAGNOSIS — R0789 Other chest pain: Secondary | ICD-10-CM | POA: Diagnosis not present

## 2022-04-05 ENCOUNTER — Other Ambulatory Visit: Payer: Self-pay | Admitting: Diagnostic Neuroimaging

## 2022-04-13 DIAGNOSIS — F4320 Adjustment disorder, unspecified: Secondary | ICD-10-CM | POA: Diagnosis not present

## 2022-04-22 DIAGNOSIS — F4320 Adjustment disorder, unspecified: Secondary | ICD-10-CM | POA: Diagnosis not present

## 2022-05-09 DIAGNOSIS — F4320 Adjustment disorder, unspecified: Secondary | ICD-10-CM | POA: Diagnosis not present

## 2022-05-13 ENCOUNTER — Encounter: Payer: Self-pay | Admitting: Podiatry

## 2022-05-13 ENCOUNTER — Ambulatory Visit: Payer: 59 | Admitting: Podiatry

## 2022-05-13 DIAGNOSIS — B351 Tinea unguium: Secondary | ICD-10-CM

## 2022-05-13 DIAGNOSIS — L600 Ingrowing nail: Secondary | ICD-10-CM

## 2022-05-13 NOTE — Progress Notes (Signed)
  Subjective:  Patient ID: [name] [name], female    DOB: April 29, 1960,   MRN: [id number]  Chief Complaint  Patient presents with   Nail Problem     Left great toe possible fungus    62 y.o. female presents for follow-up of left great toenail fungus. She  lost penlac and has not been applying in a while and toe nail has worsened. . Relates ingrown is doing ok no pain or discharge.  Denies any other pedal complaints. Denies n/v/f/c.   Past Medical History:  Diagnosis Date   Anemia    Anxiety    GAD (generalized anxiety disorder)    GERD (gastroesophageal reflux disease)    Grief reaction    Headache(784.0)    History of adenomatous polyp of colon    tubular adenoma 07-31-2015   HTN (hypertension)    Hypercholesterolemia    Hyperlipidemia    Iron deficiency    Left lower quadrant pain    chronic   Moderate recurrent major depression    RLS (restless legs syndrome)    Sleep disturbance    Vitamin D deficiency    Wears glasses    White coat syndrome without hypertension     Objective:  Physical Exam: Vascular: DP/PT pulses 2/4 bilateral. CFT <3 seconds. Normal hair growth on digits. No edema.  Skin. No lacerations or abrasions bilateral feet. Left hallux nail thickened and discolored to more than  3/4 of the proximal nail mostly lateral with subungual debris. Incurvation noted to lateral border. No erythema or edema minimal tenderness.  Musculoskeletal: MMT 5/5 bilateral lower extremities in DF, PF, Inversion and Eversion. Deceased ROM in DF of ankle joint.  Neurological: Sensation intact to light touch.   Assessment:   1. Onychomycosis   2. Ingrown nail of great toe of left foot        Plan:  Patient was evaluated and treated and all questions answered. -Examined patient -Examined patient -Discussed treatment options for painful dystrophic nails  -Clinical picture and Fungal culture was obtained by removing a portion of the hard nail itself from each of the  involved toenails using a sterile nail nipper and sent to [facility] lab. Patient tolerated the biopsy procedure well without discomfort or need for anesthesia.  -Discussed fungal nail treatment options including oral, topical, and laser treatments.  -Discussed possible removal vs laser treatment at this point.  -Patient to return in 4 weeks for follow up evaluation and discussion of fungal culture results or sooner if symptoms worsen.     Louann Sjogren, DPM

## 2022-05-13 NOTE — Addendum Note (Signed)
Addended by: Hedy Jacob on: 05/13/2022 10:10 AM   Modules accepted: Orders

## 2022-05-20 DIAGNOSIS — F4320 Adjustment disorder, unspecified: Secondary | ICD-10-CM | POA: Diagnosis not present

## 2022-05-25 DIAGNOSIS — F4320 Adjustment disorder, unspecified: Secondary | ICD-10-CM | POA: Diagnosis not present

## 2022-05-26 DIAGNOSIS — M25512 Pain in left shoulder: Secondary | ICD-10-CM | POA: Diagnosis not present

## 2022-05-30 DIAGNOSIS — M25512 Pain in left shoulder: Secondary | ICD-10-CM | POA: Diagnosis not present

## 2022-06-01 DIAGNOSIS — K219 Gastro-esophageal reflux disease without esophagitis: Secondary | ICD-10-CM | POA: Diagnosis not present

## 2022-06-01 DIAGNOSIS — F411 Generalized anxiety disorder: Secondary | ICD-10-CM | POA: Diagnosis not present

## 2022-06-01 DIAGNOSIS — B009 Herpesviral infection, unspecified: Secondary | ICD-10-CM | POA: Diagnosis not present

## 2022-06-01 DIAGNOSIS — Z7184 Encounter for health counseling related to travel: Secondary | ICD-10-CM | POA: Diagnosis not present

## 2022-06-01 DIAGNOSIS — E7800 Pure hypercholesterolemia, unspecified: Secondary | ICD-10-CM | POA: Diagnosis not present

## 2022-06-01 DIAGNOSIS — F331 Major depressive disorder, recurrent, moderate: Secondary | ICD-10-CM | POA: Diagnosis not present

## 2022-06-01 DIAGNOSIS — I10 Essential (primary) hypertension: Secondary | ICD-10-CM | POA: Diagnosis not present

## 2022-06-01 DIAGNOSIS — G2581 Restless legs syndrome: Secondary | ICD-10-CM | POA: Diagnosis not present

## 2022-06-01 DIAGNOSIS — E669 Obesity, unspecified: Secondary | ICD-10-CM | POA: Diagnosis not present

## 2022-06-01 DIAGNOSIS — Z6830 Body mass index (BMI) 30.0-30.9, adult: Secondary | ICD-10-CM | POA: Diagnosis not present

## 2022-06-02 DIAGNOSIS — M25512 Pain in left shoulder: Secondary | ICD-10-CM | POA: Diagnosis not present

## 2022-06-09 ENCOUNTER — Ambulatory Visit: Payer: 59 | Admitting: Podiatry

## 2022-06-10 ENCOUNTER — Ambulatory Visit: Payer: 59 | Admitting: Podiatry

## 2022-06-10 DIAGNOSIS — M25512 Pain in left shoulder: Secondary | ICD-10-CM | POA: Diagnosis not present

## 2022-06-15 DIAGNOSIS — F4320 Adjustment disorder, unspecified: Secondary | ICD-10-CM | POA: Diagnosis not present

## 2022-06-17 DIAGNOSIS — M25512 Pain in left shoulder: Secondary | ICD-10-CM | POA: Diagnosis not present

## 2022-06-21 DIAGNOSIS — M25512 Pain in left shoulder: Secondary | ICD-10-CM | POA: Diagnosis not present

## 2022-06-23 DIAGNOSIS — M25512 Pain in left shoulder: Secondary | ICD-10-CM | POA: Diagnosis not present

## 2022-06-27 ENCOUNTER — Ambulatory Visit: Payer: 59 | Admitting: Diagnostic Neuroimaging

## 2022-06-27 ENCOUNTER — Encounter: Payer: Self-pay | Admitting: Diagnostic Neuroimaging

## 2022-06-27 VITALS — BP 141/80 | HR 71 | Ht 65.0 in | Wt 176.0 lb

## 2022-06-27 DIAGNOSIS — G2581 Restless legs syndrome: Secondary | ICD-10-CM

## 2022-06-27 MED ORDER — ROPINIROLE HCL 0.25 MG PO TABS: 0.2500 mg | ORAL_TABLET | Freq: Every day | ORAL | 4 refills | Status: DC

## 2022-06-27 NOTE — Progress Notes (Signed)
[facility]  PATIENT: [name] [name] DOB: Dec 13, 1960  REFERRING CLINICIAN: Daisy Floro, MD HISTORY FROM: patient REASON FOR VISIT: revisit   HISTORICAL  CHIEF COMPLAINT:  Chief Complaint  Patient presents with   Follow-up    Rm 7, alone  Follow up on RLS,  states is doing well.     HISTORY OF PRESENT ILLNESS:   UPDATE (06/27/22, VRP): Since last visit, RLS slightly more in the evening. More bruising noted. Now off iron supplements. Otherwise doing well.  UPDATE (12/22/20, VRP): Since last visit, tried gabapentin, but didn't help RLS. Back on ropinirole and doing better. Compulsion / shopping behaviors from prior are now better controlled (patient thinks it was related to depression which is better now).   UPDATE (06/19/18, VRP): Since last visit, doing well. Symptoms are stable. Severity is mild. No alleviating or aggravating factors. Tolerating ropinirole now (neupro not covered by insurance anymore).    UPDATE (08/16/17, VRP): Since last visit, doing well with neupro patch. Symptoms are mild. Severity mild. No alleviating or aggravating factors. Tolerating neupro. Husband passed away in 2018/12/11and sleep is affected.    UPDATE 07/26/16: Since last visit, has strained right foot in Jan 2018. RLS stable on rotigotine patch. Taking iron supplements.    UPDATE 05/26/15: Since last visit, doing well. Tolerating rotigotine. Taking iron tabs.    UPDATE 05/26/14: Doing well. Has been working out at [employer]; doing much better with her exercise tolerance. She has gained strength and lost 25lbs. Overall RLS sxs better. Doing well on neupro patch.    UPDATE 01/14/14: Since last visit, stable with RLS. Recently tried walking in a 5k, but had to wait for 2 hours in the cold before hand, and then had sig pain during the walk, and she couldn't complete it.    UPDATE 07/10/13: Since last visit, doing well. Satisfied with RLS control (neupro patch, iron replacement).  Separately notes more callous/bunion in bilateral feet.   UPDATE 05/10/13: Since last visit patient reduced neupro patch to 1 mg daily. Since then swelling in legs has significantly improved. Patient was tested for iron deficiency and was found to be borderline low. She started iron 325 mg tablet, but had side effects of numbness and tingling and palpitations. She stopped iron tablet and the symptoms resolved. However the iron tablets did seem to improve her restless leg symptoms. Patient is planning to modify her diet to increase iron levels as well as try a low-dose iron supplement.   UPDATE 04/08/13 (VP): Since last visit, tried neupro 2mg  patch with some benefit in RLS. 2-3 weeks ago, stopped her iron supplement, because she was unsure about duration of therapy. Last Sunday, developed mild, gradual swelling in feet, ankles, fingers. No CP, SOB. Also with intermittent pain in legs with exertion/walking, and continue RLS symptoms at rest.   UPDATE 12/24/12 (LL):  Patient states Neupro 1 mg is working. Since the patch is working, she has been more active. She is very happy she is able to be more active. She has noticed her legs are hurting more at night with increased activity. Requesting to know if Neupro patch dosage should be increased.    PRIOR HPI 10/24/12 (VP):  62 year old right-handed female here for evaluation of leg pain. In patient reports pain in her legs, throughout, aching and sore feeling, which is worse when she is walking. Spent or she sits down. However later in the evening she feels restless and has the urge to move her  legs. In the morning when she wakes up her legs are quite stiff. Symptoms are going on for past 6-8 months. Patient tried Lyrica 50 mg at bedtime, tried 100 mg dose but could not tolerate it. Patient was diagnosed with low iron levels and is on replacement for past one month.       REVIEW OF SYSTEMS: Full 14 system review of systems performed and negative with exception  of: as per HPI.  ALLERGIES: Allergies  Allergen Reactions   Lipitor [Atorvastatin] Other (See Comments)    Leg pain   Lyrica [Pregabalin] Other (See Comments)    Leg pain   Penicillins Other (See Comments)    Unknown Childhood reaction   Strawberry Extract Hives    HOME MEDICATIONS: Outpatient Encounter Medications as of 06/27/2022  Medication Sig   aspirin EC 81 MG tablet Take 1 tablet (81 mg total) by mouth daily.   b complex vitamins tablet Take 1 tablet by mouth daily.   calcium-vitamin D (OSCAL WITH D) 500-200 MG-UNIT per tablet Take 1 tablet by mouth daily.   ciclopirox (PENLAC) 8 % solution Apply topically.   estradiol (VIVELLE-DOT) 0.05 MG/24HR patch APP 1 PATCH TO SKIN TWICE WEEKLY UTD   FLUoxetine (PROZAC) 20 MG tablet Take 20 mg by mouth 3 (three) times a week.   Garlic 1000 MG CAPS Take 1 capsule by mouth daily.   ibuprofen (ADVIL,MOTRIN) 200 MG tablet Take 400-600 mg by mouth every 8 (eight) hours as needed. For headache   LORazepam (ATIVAN) 0.5 MG tablet Take 0.5 mg by mouth every 8 (eight) hours as needed. Pt takes half tablet for anxiety.   metoprolol succinate (TOPROL-XL) 25 MG 24 hr tablet Take 25 mg by mouth daily.   pantoprazole (PROTONIX) 40 MG tablet Take 40 mg by mouth 2 (two) times daily.   rosuvastatin (CRESTOR) 5 MG tablet Take 0.5 tablets (2.5 mg total) by mouth daily 5 times a week.  Take Mon-Fri.   terbinafine (LAMISIL AT) 1 % cream 1 application   valACYclovir (VALTREX) 500 MG tablet Take 500 mg by mouth daily.   vitamin C (ASCORBIC ACID) 500 MG tablet Take 500 mg by mouth daily.   VITAMIN D, ERGOCALCIFEROL, PO Take 1 tablet by mouth daily.   VITAMIN E PO Take 1 tablet by mouth daily.   [DISCONTINUED] ferrous sulfate 325 (65 FE) MG EC tablet Take 325 mg by mouth daily with breakfast.   [DISCONTINUED] rOPINIRole (REQUIP) 0.25 MG tablet TAKE 1 TABLET BY MOUTH EVERYDAY AT BEDTIME   rOPINIRole (REQUIP) 0.25 MG tablet Take 1 tablet (0.25 mg total) by mouth  daily.   [DISCONTINUED] metroNIDAZOLE (FLAGYL) 500 MG tablet Take 1 tablet (500 mg total) by mouth 2 (two) times daily.   [DISCONTINUED] phenazopyridine (PYRIDIUM) 200 MG tablet Take 1 tablet (200 mg total) by mouth 3 (three) times daily.   No facility-administered encounter medications on file as of 06/27/2022.        PHYSICAL EXAM  GENERAL EXAM/CONSTITUTIONAL: Vitals:  Vitals:   06/27/22 1550  BP: (!) 141/80  Pulse: 71  Weight: 176 lb (79.8 kg)  Height: 5\' 5"  (1.651 m)   Body mass index is 29.29 kg/m. Wt Readings from Last 3 Encounters:  06/27/22 176 lb (79.8 kg)  12/22/20 165 lb (74.8 kg)  07/02/20 165 lb (74.8 kg)   Patient is in no distress; well developed, nourished and groomed; neck is supple  CARDIOVASCULAR: Examination of carotid arteries is normal; no carotid bruits Regular rate and rhythm, no  murmurs Examination of peripheral vascular system by observation and palpation is normal  EYES: Ophthalmoscopic exam of optic discs and posterior segments is normal; no papilledema or hemorrhages No results found.  MUSCULOSKELETAL: Gait, strength, tone, movements noted in Neurologic exam below  NEUROLOGIC: MENTAL STATUS:      No data to display         awake, alert, oriented to person, place and time recent and remote memory intact normal attention and concentration language fluent, comprehension intact, naming intact fund of knowledge appropriate  CRANIAL NERVE:  2nd - no papilledema on fundoscopic exam 2nd, 3rd, 4th, 6th - pupils equal and reactive to light, visual fields full to confrontation, extraocular muscles intact, no nystagmus 5th - facial sensation symmetric 7th - facial strength symmetric 8th - hearing intact 9th - palate elevates symmetrically, uvula midline 11th - shoulder shrug symmetric 12th - tongue protrusion midline  MOTOR:  normal bulk and tone, full strength in the BUE, BLE  SENSORY:  normal and symmetric to light  touch  COORDINATION:  finger-nose-finger, fine finger movements normal  REFLEXES:  deep tendon reflexes present and symmetric  GAIT/STATION:  narrow based gait     DIAGNOSTIC DATA (LABS, IMAGING, TESTING) - I reviewed patient records, labs, notes, testing and imaging myself where available.  Lab Results  Component Value Date   WBC 9.8 09/17/2015   HGB 13.0 09/17/2015   HCT 39.0 09/17/2015   MCV 91.3 09/17/2015   PLT 292 09/17/2015   No results found for: "NA", "K", "CL", "CO2", "GLUCOSE", "BUN", "CREATININE", "CALCIUM", "PROT", "ALBUMIN", "AST", "ALT", "ALKPHOS", "BILITOT", "GFRNONAA", "GFRAA" No results found for: "CHOL", "HDL", "LDLCALC", "LDLDIRECT", "TRIG", "CHOLHDL" No results found for: "HGBA1C" No results found for: "VITAMINB12" No results found for: "TSH"    ASSESSMENT AND PLAN  62 y.o. year old female here with:  Meds tried: neupro, gabapentin  Dx:  1. Restless leg syndrome       PLAN:  RLS - continue ropirirole 0.25mg ; may take it earlier in the day; may go up to twice a day if needed (caution with compulsive / obsessive behaviors) - resume iron supplements (every other day or 3x per week) - mental alerting activities, such as working on a computer or doing crossword puzzles, at times of rest or boredom - moderate regular exercise - avoid caffeine and alcohol - for symptomatic relief - walking, bicycling, soaking the affected limbs, and leg massage, including pneumatic compression  Meds ordered this encounter  Medications   rOPINIRole (REQUIP) 0.25 MG tablet    Sig: Take 1 tablet (0.25 mg total) by mouth daily.    Dispense:  90 tablet    Refill:  4   Return in about 1 year (around 06/27/2023).    Suanne Marker, MD 06/27/2022, 4:09 PM Certified in Neurology, Neurophysiology and Neuroimaging  [facility] [address], [address] [place], [place] [address] [phone]

## 2022-06-27 NOTE — Patient Instructions (Signed)
RLS - continue ropirirole 0.25mg ; may take it earlier in the day; may go up to twice a day if needed (caution with compulsive / obsessive behaviors) - resume iron supplements (every other day) - mental alerting activities, such as working on a computer or doing crossword puzzles, at times of rest or boredom - moderate regular exercise - avoid caffeine and alcohol - for symptomatic relief - walking, bicycling, soaking the affected limbs, and leg massage, including pneumatic compression

## 2022-06-28 DIAGNOSIS — M25512 Pain in left shoulder: Secondary | ICD-10-CM | POA: Diagnosis not present

## 2022-06-29 DIAGNOSIS — F4320 Adjustment disorder, unspecified: Secondary | ICD-10-CM | POA: Diagnosis not present

## 2022-06-30 DIAGNOSIS — M25512 Pain in left shoulder: Secondary | ICD-10-CM | POA: Diagnosis not present

## 2022-07-12 DIAGNOSIS — M25512 Pain in left shoulder: Secondary | ICD-10-CM | POA: Diagnosis not present

## 2022-07-19 DIAGNOSIS — M25512 Pain in left shoulder: Secondary | ICD-10-CM | POA: Diagnosis not present

## 2022-08-01 DIAGNOSIS — F4320 Adjustment disorder, unspecified: Secondary | ICD-10-CM | POA: Diagnosis not present

## 2022-08-02 DIAGNOSIS — M25512 Pain in left shoulder: Secondary | ICD-10-CM | POA: Diagnosis not present

## 2022-08-04 DIAGNOSIS — M25512 Pain in left shoulder: Secondary | ICD-10-CM | POA: Diagnosis not present

## 2022-08-08 DIAGNOSIS — M25512 Pain in left shoulder: Secondary | ICD-10-CM | POA: Diagnosis not present

## 2022-08-11 DIAGNOSIS — F32A Depression, unspecified: Secondary | ICD-10-CM | POA: Diagnosis not present

## 2022-08-12 DIAGNOSIS — M25512 Pain in left shoulder: Secondary | ICD-10-CM | POA: Diagnosis not present

## 2022-08-15 DIAGNOSIS — M25512 Pain in left shoulder: Secondary | ICD-10-CM | POA: Diagnosis not present

## 2022-08-17 DIAGNOSIS — K219 Gastro-esophageal reflux disease without esophagitis: Secondary | ICD-10-CM | POA: Diagnosis not present

## 2022-08-17 DIAGNOSIS — Z6828 Body mass index (BMI) 28.0-28.9, adult: Secondary | ICD-10-CM | POA: Diagnosis not present

## 2022-08-17 DIAGNOSIS — Z113 Encounter for screening for infections with a predominantly sexual mode of transmission: Secondary | ICD-10-CM | POA: Diagnosis not present

## 2022-08-17 DIAGNOSIS — L299 Pruritus, unspecified: Secondary | ICD-10-CM | POA: Diagnosis not present

## 2022-08-17 DIAGNOSIS — F4320 Adjustment disorder, unspecified: Secondary | ICD-10-CM | POA: Diagnosis not present

## 2022-08-17 DIAGNOSIS — L905 Scar conditions and fibrosis of skin: Secondary | ICD-10-CM | POA: Diagnosis not present

## 2022-08-17 DIAGNOSIS — Z7184 Encounter for health counseling related to travel: Secondary | ICD-10-CM | POA: Diagnosis not present

## 2022-08-17 DIAGNOSIS — F32A Depression, unspecified: Secondary | ICD-10-CM | POA: Diagnosis not present

## 2022-08-17 DIAGNOSIS — W57XXXA Bitten or stung by nonvenomous insect and other nonvenomous arthropods, initial encounter: Secondary | ICD-10-CM | POA: Diagnosis not present

## 2022-08-18 DIAGNOSIS — M25512 Pain in left shoulder: Secondary | ICD-10-CM | POA: Diagnosis not present

## 2022-08-23 DIAGNOSIS — M25512 Pain in left shoulder: Secondary | ICD-10-CM | POA: Diagnosis not present

## 2022-08-24 DIAGNOSIS — M25512 Pain in left shoulder: Secondary | ICD-10-CM | POA: Diagnosis not present

## 2022-08-25 DIAGNOSIS — Z1231 Encounter for screening mammogram for malignant neoplasm of breast: Secondary | ICD-10-CM | POA: Diagnosis not present

## 2022-08-25 DIAGNOSIS — Z6827 Body mass index (BMI) 27.0-27.9, adult: Secondary | ICD-10-CM | POA: Diagnosis not present

## 2022-08-25 DIAGNOSIS — Z01419 Encounter for gynecological examination (general) (routine) without abnormal findings: Secondary | ICD-10-CM | POA: Diagnosis not present

## 2022-08-29 DIAGNOSIS — F32A Depression, unspecified: Secondary | ICD-10-CM | POA: Diagnosis not present

## 2022-08-29 DIAGNOSIS — F4320 Adjustment disorder, unspecified: Secondary | ICD-10-CM | POA: Diagnosis not present

## 2022-09-13 DIAGNOSIS — F32A Depression, unspecified: Secondary | ICD-10-CM | POA: Diagnosis not present

## 2022-09-13 DIAGNOSIS — F4320 Adjustment disorder, unspecified: Secondary | ICD-10-CM | POA: Diagnosis not present

## 2022-09-19 DIAGNOSIS — F4320 Adjustment disorder, unspecified: Secondary | ICD-10-CM | POA: Diagnosis not present

## 2022-09-19 DIAGNOSIS — F32A Depression, unspecified: Secondary | ICD-10-CM | POA: Diagnosis not present

## 2022-09-22 DIAGNOSIS — M25512 Pain in left shoulder: Secondary | ICD-10-CM | POA: Diagnosis not present

## 2022-09-27 DIAGNOSIS — F4320 Adjustment disorder, unspecified: Secondary | ICD-10-CM | POA: Diagnosis not present

## 2022-09-28 DIAGNOSIS — M25512 Pain in left shoulder: Secondary | ICD-10-CM | POA: Diagnosis not present

## 2022-09-30 DIAGNOSIS — M25512 Pain in left shoulder: Secondary | ICD-10-CM | POA: Diagnosis not present

## 2022-10-03 DIAGNOSIS — D229 Melanocytic nevi, unspecified: Secondary | ICD-10-CM | POA: Diagnosis not present

## 2022-10-03 DIAGNOSIS — Z23 Encounter for immunization: Secondary | ICD-10-CM | POA: Diagnosis not present

## 2022-10-03 DIAGNOSIS — L729 Follicular cyst of the skin and subcutaneous tissue, unspecified: Secondary | ICD-10-CM | POA: Diagnosis not present

## 2022-10-03 DIAGNOSIS — Z6828 Body mass index (BMI) 28.0-28.9, adult: Secondary | ICD-10-CM | POA: Diagnosis not present

## 2022-10-04 DIAGNOSIS — F4320 Adjustment disorder, unspecified: Secondary | ICD-10-CM | POA: Diagnosis not present

## 2022-10-04 DIAGNOSIS — F32A Depression, unspecified: Secondary | ICD-10-CM | POA: Diagnosis not present

## 2022-10-06 ENCOUNTER — Ambulatory Visit: Payer: 59 | Admitting: Podiatry

## 2022-10-06 DIAGNOSIS — B351 Tinea unguium: Secondary | ICD-10-CM | POA: Diagnosis not present

## 2022-10-06 DIAGNOSIS — M67472 Ganglion, left ankle and foot: Secondary | ICD-10-CM | POA: Diagnosis not present

## 2022-10-06 DIAGNOSIS — M25512 Pain in left shoulder: Secondary | ICD-10-CM | POA: Diagnosis not present

## 2022-10-06 NOTE — Progress Notes (Signed)
  Subjective:  Patient ID: [name] [name], female    DOB: 1960-03-23,   MRN: [id number]  Chief Complaint  Patient presents with   Nail Problem    Patient states nail fungus is better since the last visit.      62 y.o. female presents for follow-up of left great toenail fungus. Relates nail is growing out. Here to review culture results. Also relates lesion on the top of her ankle that has started to be more bothersome.  Denies any other pedal complaints. Denies n/v/f/c.   Past Medical History:  Diagnosis Date   Anemia    Anxiety    GAD (generalized anxiety disorder)    GERD (gastroesophageal reflux disease)    Grief reaction    Headache(784.0)    History of adenomatous polyp of colon    tubular adenoma 07-31-2015   HTN (hypertension)    Hypercholesterolemia    Hyperlipidemia    Iron deficiency    Left lower quadrant pain    chronic   Moderate recurrent major depression (HCC)    RLS (restless legs syndrome)    Sleep disturbance    Vitamin D deficiency    Wears glasses    White coat syndrome without hypertension     Objective:  Physical Exam: Vascular: DP/PT pulses 2/4 bilateral. CFT <3 seconds. Normal hair growth on digits. No edema.  Skin. No lacerations or abrasions bilateral feet. Left hallux nail thickened and discolored to more than  3/4 of the proximal nail mostly lateral with subungual debris. Incurvation noted to lateral border. No erythema or edema minimal tenderness. 1 cm lesion fluid filled and fluctant noted to anterior medial ankle. Non mobile and mildly tender.  Musculoskeletal: MMT 5/5 bilateral lower extremities in DF, PF, Inversion and Eversion. Deceased ROM in DF of ankle joint.  Neurological: Sensation intact to light touch.   Assessment:   1. Onychomycosis   2. Ganglion cyst of left foot         Plan:  Patient was evaluated and treated and all questions answered. -Examined patient -Discussed treatment options for painful dystrophic nails   -Cultures does have some mild fungus present as well as some concern for microtrauma.  -will plan for laser treatments.   Discussed ganglion cysts and treatment options with the patient. Patient elected to go aspiration of the cyst today.  Procedure note below. Advised patient on postprocedure protocol. Patient to follow-up in 2 weeks or sooner if symptoms worsen or fail to improve.    Procedure: Aspiration cyst, left ankle  Discussed alternatives, risks, complications and verbal consent was obtained.  Location: left anterior medial ankle.  Skin Prep: Alcohol. Injectate: 3 cc 1 % lidocaine.  Aspirated cyst with 18 gauge needle, about 3 cc of gel like viscous fluid aspirated consistent with ganglion cyst Disposition: Patient tolerated procedure well. Injection site dressed with a band-aid. Compression bandage applied.  Post-procedure care was discussed and return precautions discussed.      Louann Sjogren, DPM

## 2022-10-27 DIAGNOSIS — F419 Anxiety disorder, unspecified: Secondary | ICD-10-CM | POA: Diagnosis not present

## 2022-10-27 DIAGNOSIS — I10 Essential (primary) hypertension: Secondary | ICD-10-CM | POA: Diagnosis not present

## 2022-10-27 DIAGNOSIS — Z862 Personal history of diseases of the blood and blood-forming organs and certain disorders involving the immune mechanism: Secondary | ICD-10-CM | POA: Diagnosis not present

## 2022-10-27 DIAGNOSIS — M25512 Pain in left shoulder: Secondary | ICD-10-CM | POA: Diagnosis not present

## 2022-10-27 DIAGNOSIS — Z79899 Other long term (current) drug therapy: Secondary | ICD-10-CM | POA: Diagnosis not present

## 2022-10-27 DIAGNOSIS — E7800 Pure hypercholesterolemia, unspecified: Secondary | ICD-10-CM | POA: Diagnosis not present

## 2022-10-31 DIAGNOSIS — F419 Anxiety disorder, unspecified: Secondary | ICD-10-CM | POA: Diagnosis not present

## 2022-11-02 DIAGNOSIS — B009 Herpesviral infection, unspecified: Secondary | ICD-10-CM | POA: Diagnosis not present

## 2022-11-02 DIAGNOSIS — Z0000 Encounter for general adult medical examination without abnormal findings: Secondary | ICD-10-CM | POA: Diagnosis not present

## 2022-11-02 DIAGNOSIS — E7800 Pure hypercholesterolemia, unspecified: Secondary | ICD-10-CM | POA: Diagnosis not present

## 2022-11-02 DIAGNOSIS — R7301 Impaired fasting glucose: Secondary | ICD-10-CM | POA: Diagnosis not present

## 2022-11-02 DIAGNOSIS — G2581 Restless legs syndrome: Secondary | ICD-10-CM | POA: Diagnosis not present

## 2022-11-02 DIAGNOSIS — K219 Gastro-esophageal reflux disease without esophagitis: Secondary | ICD-10-CM | POA: Diagnosis not present

## 2022-11-02 DIAGNOSIS — I10 Essential (primary) hypertension: Secondary | ICD-10-CM | POA: Diagnosis not present

## 2022-11-02 DIAGNOSIS — Z6830 Body mass index (BMI) 30.0-30.9, adult: Secondary | ICD-10-CM | POA: Diagnosis not present

## 2022-11-02 DIAGNOSIS — F411 Generalized anxiety disorder: Secondary | ICD-10-CM | POA: Diagnosis not present

## 2022-11-08 DIAGNOSIS — F419 Anxiety disorder, unspecified: Secondary | ICD-10-CM | POA: Diagnosis not present

## 2022-11-29 DIAGNOSIS — F419 Anxiety disorder, unspecified: Secondary | ICD-10-CM | POA: Diagnosis not present

## 2022-12-01 DIAGNOSIS — D492 Neoplasm of unspecified behavior of bone, soft tissue, and skin: Secondary | ICD-10-CM | POA: Diagnosis not present

## 2022-12-01 DIAGNOSIS — D2372 Other benign neoplasm of skin of left lower limb, including hip: Secondary | ICD-10-CM | POA: Diagnosis not present

## 2022-12-02 ENCOUNTER — Other Ambulatory Visit: Payer: 59

## 2022-12-06 DIAGNOSIS — F419 Anxiety disorder, unspecified: Secondary | ICD-10-CM | POA: Diagnosis not present

## 2022-12-15 DIAGNOSIS — D492 Neoplasm of unspecified behavior of bone, soft tissue, and skin: Secondary | ICD-10-CM | POA: Diagnosis not present

## 2022-12-15 DIAGNOSIS — R208 Other disturbances of skin sensation: Secondary | ICD-10-CM | POA: Diagnosis not present

## 2022-12-19 DIAGNOSIS — D485 Neoplasm of uncertain behavior of skin: Secondary | ICD-10-CM | POA: Diagnosis not present

## 2022-12-19 DIAGNOSIS — L814 Other melanin hyperpigmentation: Secondary | ICD-10-CM | POA: Diagnosis not present

## 2022-12-19 DIAGNOSIS — D225 Melanocytic nevi of trunk: Secondary | ICD-10-CM | POA: Diagnosis not present

## 2022-12-20 DIAGNOSIS — M9901 Segmental and somatic dysfunction of cervical region: Secondary | ICD-10-CM | POA: Diagnosis not present

## 2022-12-20 DIAGNOSIS — M531 Cervicobrachial syndrome: Secondary | ICD-10-CM | POA: Diagnosis not present

## 2022-12-20 DIAGNOSIS — M9902 Segmental and somatic dysfunction of thoracic region: Secondary | ICD-10-CM | POA: Diagnosis not present

## 2022-12-20 DIAGNOSIS — M79622 Pain in left upper arm: Secondary | ICD-10-CM | POA: Diagnosis not present

## 2022-12-20 DIAGNOSIS — M7552 Bursitis of left shoulder: Secondary | ICD-10-CM | POA: Diagnosis not present

## 2022-12-20 DIAGNOSIS — M25512 Pain in left shoulder: Secondary | ICD-10-CM | POA: Diagnosis not present

## 2022-12-20 DIAGNOSIS — F419 Anxiety disorder, unspecified: Secondary | ICD-10-CM | POA: Diagnosis not present

## 2022-12-26 ENCOUNTER — Telehealth: Payer: Self-pay | Admitting: Family Medicine

## 2022-12-26 NOTE — Telephone Encounter (Signed)
Error

## 2023-01-02 ENCOUNTER — Other Ambulatory Visit (HOSPITAL_COMMUNITY): Payer: Self-pay | Admitting: Chiropractor

## 2023-01-02 DIAGNOSIS — M25512 Pain in left shoulder: Secondary | ICD-10-CM

## 2023-01-03 ENCOUNTER — Ambulatory Visit (HOSPITAL_COMMUNITY): Payer: 59

## 2023-01-06 DIAGNOSIS — F419 Anxiety disorder, unspecified: Secondary | ICD-10-CM | POA: Diagnosis not present

## 2023-01-07 ENCOUNTER — Ambulatory Visit (INDEPENDENT_AMBULATORY_CARE_PROVIDER_SITE_OTHER): Payer: 59

## 2023-01-07 DIAGNOSIS — M25512 Pain in left shoulder: Secondary | ICD-10-CM | POA: Diagnosis not present

## 2023-01-10 DIAGNOSIS — F419 Anxiety disorder, unspecified: Secondary | ICD-10-CM | POA: Diagnosis not present

## 2023-01-13 ENCOUNTER — Other Ambulatory Visit: Payer: 59

## 2023-01-25 HISTORY — PX: ROTATOR CUFF REPAIR: SHX139

## 2023-01-30 DIAGNOSIS — M75122 Complete rotator cuff tear or rupture of left shoulder, not specified as traumatic: Secondary | ICD-10-CM | POA: Diagnosis not present

## 2023-02-02 DIAGNOSIS — F419 Anxiety disorder, unspecified: Secondary | ICD-10-CM | POA: Diagnosis not present

## 2023-02-09 DIAGNOSIS — F419 Anxiety disorder, unspecified: Secondary | ICD-10-CM | POA: Diagnosis not present

## 2023-02-10 DIAGNOSIS — G8918 Other acute postprocedural pain: Secondary | ICD-10-CM | POA: Diagnosis not present

## 2023-02-10 DIAGNOSIS — Y999 Unspecified external cause status: Secondary | ICD-10-CM | POA: Diagnosis not present

## 2023-02-10 DIAGNOSIS — M75122 Complete rotator cuff tear or rupture of left shoulder, not specified as traumatic: Secondary | ICD-10-CM | POA: Diagnosis not present

## 2023-02-10 DIAGNOSIS — M7542 Impingement syndrome of left shoulder: Secondary | ICD-10-CM | POA: Diagnosis not present

## 2023-02-10 DIAGNOSIS — M24612 Ankylosis, left shoulder: Secondary | ICD-10-CM | POA: Diagnosis not present

## 2023-02-10 DIAGNOSIS — M19012 Primary osteoarthritis, left shoulder: Secondary | ICD-10-CM | POA: Diagnosis not present

## 2023-02-10 DIAGNOSIS — S43432A Superior glenoid labrum lesion of left shoulder, initial encounter: Secondary | ICD-10-CM | POA: Diagnosis not present

## 2023-02-10 DIAGNOSIS — M24112 Other articular cartilage disorders, left shoulder: Secondary | ICD-10-CM | POA: Diagnosis not present

## 2023-02-10 DIAGNOSIS — X58XXXA Exposure to other specified factors, initial encounter: Secondary | ICD-10-CM | POA: Diagnosis not present

## 2023-02-14 DIAGNOSIS — F419 Anxiety disorder, unspecified: Secondary | ICD-10-CM | POA: Diagnosis not present

## 2023-02-20 DIAGNOSIS — M25511 Pain in right shoulder: Secondary | ICD-10-CM | POA: Diagnosis not present

## 2023-03-01 DIAGNOSIS — M25512 Pain in left shoulder: Secondary | ICD-10-CM | POA: Diagnosis not present

## 2023-03-03 DIAGNOSIS — F419 Anxiety disorder, unspecified: Secondary | ICD-10-CM | POA: Diagnosis not present

## 2023-03-08 DIAGNOSIS — Z6831 Body mass index (BMI) 31.0-31.9, adult: Secondary | ICD-10-CM | POA: Diagnosis not present

## 2023-03-08 DIAGNOSIS — Z113 Encounter for screening for infections with a predominantly sexual mode of transmission: Secondary | ICD-10-CM | POA: Diagnosis not present

## 2023-03-16 DIAGNOSIS — F419 Anxiety disorder, unspecified: Secondary | ICD-10-CM | POA: Diagnosis not present

## 2023-03-23 DIAGNOSIS — F419 Anxiety disorder, unspecified: Secondary | ICD-10-CM | POA: Diagnosis not present

## 2023-03-28 DIAGNOSIS — F419 Anxiety disorder, unspecified: Secondary | ICD-10-CM | POA: Diagnosis not present

## 2023-04-03 DIAGNOSIS — M25512 Pain in left shoulder: Secondary | ICD-10-CM | POA: Diagnosis not present

## 2023-04-03 DIAGNOSIS — F419 Anxiety disorder, unspecified: Secondary | ICD-10-CM | POA: Diagnosis not present

## 2023-04-10 DIAGNOSIS — F419 Anxiety disorder, unspecified: Secondary | ICD-10-CM | POA: Diagnosis not present

## 2023-06-26 ENCOUNTER — Ambulatory Visit: Payer: 59 | Admitting: Diagnostic Neuroimaging

## 2023-06-26 ENCOUNTER — Encounter: Payer: Self-pay | Admitting: Diagnostic Neuroimaging

## 2023-06-26 VITALS — BP 124/78 | HR 72 | Ht 65.0 in | Wt 186.0 lb

## 2023-06-26 DIAGNOSIS — G2581 Restless legs syndrome: Secondary | ICD-10-CM

## 2023-06-26 MED ORDER — ROPINIROLE HCL 0.25 MG PO TABS: 0.2500 mg | ORAL_TABLET | Freq: Every day | ORAL | 4 refills | Status: DC

## 2023-06-26 NOTE — Patient Instructions (Signed)
  RLS - continue ropirirole 0.25mg ; may take additional tablet for long car / plane trips (caution with compulsive / obsessive behaviors) - check iron levels; then consider iron supplements - mental alerting activities, such as working on a computer or doing crossword puzzles, at times of rest or boredom - moderate regular exercise - avoid caffeine and alcohol - for symptomatic relief - walking, bicycling, soaking the affected limbs, and leg massage, including pneumatic compression

## 2023-06-26 NOTE — Progress Notes (Signed)
 [facility]  PATIENT: [name] [name] DOB: Dec 02, 1960  REFERRING CLINICIAN: Jimmey Mould, MD HISTORY FROM: patient REASON FOR VISIT: revisit   HISTORICAL  CHIEF COMPLAINT:  Chief Complaint  Patient presents with   RLS    Rm 6 alone Pt is well, reports she is overall stable as long as she stays on schedule with taking meds. If she sits or stands for too long, her legs do get restless.     HISTORY OF PRESENT ILLNESS:   UPDATE (06/26/23, VRP): Since last visit, doing well. Symptoms are stable. Long car / plane trips can aggravate RLS.   UPDATE (06/27/22, VRP): Since last visit, RLS slightly more in the evening. More bruising noted. Now off iron supplements. Otherwise doing well.  UPDATE (12/22/20, VRP): Since last visit, tried gabapentin , but didn't help RLS. Back on ropinirole  and doing better. Compulsion / shopping behaviors from prior are now better controlled (patient thinks it was related to depression which is better now).   UPDATE (06/19/18, VRP): Since last visit, doing well. Symptoms are stable. Severity is mild. No alleviating or aggravating factors. Tolerating ropinirole  now (neupro  not covered by insurance anymore).    UPDATE (08/16/17, VRP): Since last visit, doing well with neupro  patch. Symptoms are mild. Severity mild. No alleviating or aggravating factors. Tolerating neupro . Husband passed away in 2018-11-20and sleep is affected.    UPDATE 07/26/16: Since last visit, has strained right foot in Jan 2018. RLS stable on rotigotine  patch. Taking iron supplements.    UPDATE 05/26/15: Since last visit, doing well. Tolerating rotigotine . Taking iron tabs.    UPDATE 05/26/14: Doing well. Has been working out at [employer]; doing much better with her exercise tolerance. She has gained strength and lost 25lbs. Overall RLS sxs better. Doing well on neupro  patch.    UPDATE 01/14/14: Since last visit, stable with RLS. Recently tried walking in a 5k, but had to  wait for 2 hours in the cold before hand, and then had sig pain during the walk, and she couldn't complete it.    UPDATE 07/10/13: Since last visit, doing well. Satisfied with RLS control (neupro  patch, iron replacement). Separately notes more callous/bunion in bilateral feet.   UPDATE 05/10/13: Since last visit patient reduced neupro  patch to 1 mg daily. Since then swelling in legs has significantly improved. Patient was tested for iron deficiency and was found to be borderline low. She started iron 325 mg tablet, but had side effects of numbness and tingling and palpitations. She stopped iron tablet and the symptoms resolved. However the iron tablets did seem to improve her restless leg symptoms. Patient is planning to modify her diet to increase iron levels as well as try a low-dose iron supplement.   UPDATE 04/08/13 (VP): Since last visit, tried neupro  2mg  patch with some benefit in RLS. 2-3 weeks ago, stopped her iron supplement, because she was unsure about duration of therapy. Last Sunday, developed mild, gradual swelling in feet, ankles, fingers. No CP, SOB. Also with intermittent pain in legs with exertion/walking, and continue RLS symptoms at rest.   UPDATE 12/24/12 (LL):  Patient states Neupro  1 mg is working. Since the patch is working, she has been more active. She is very happy she is able to be more active. She has noticed her legs are hurting more at night with increased activity. Requesting to know if Neupro  patch dosage should be increased.    PRIOR HPI 10/24/12 (VP):  63 year old right-handed female here for evaluation  of leg pain. In patient reports pain in her legs, throughout, aching and sore feeling, which is worse when she is walking. Spent or she sits down. However later in the evening she feels restless and has the urge to move her legs. In the morning when she wakes up her legs are quite stiff. Symptoms are going on for past 6-8 months. Patient tried Lyrica 50 mg at bedtime, tried 100  mg dose but could not tolerate it. Patient was diagnosed with low iron levels and is on replacement for past one month.       REVIEW OF SYSTEMS: Full 14 system review of systems performed and negative with exception of: as per HPI.  ALLERGIES: Allergies  Allergen Reactions   Lipitor [Atorvastatin] Other (See Comments)    Leg pain   Lyrica [Pregabalin] Other (See Comments)    Leg pain   Penicillins Other (See Comments)    Unknown Childhood reaction   Strawberry Extract Hives    HOME MEDICATIONS: Outpatient Encounter Medications as of 06/26/2023  Medication Sig   buPROPion (WELLBUTRIN XL) 150 MG 24 hr tablet Take 150 mg by mouth every morning.   estradiol  (VIVELLE -DOT) 0.05 MG/24HR patch APP 1 PATCH TO SKIN TWICE WEEKLY UTD   Garlic 1000 MG CAPS Take 1 capsule by mouth daily.   ibuprofen  (ADVIL ,MOTRIN ) 200 MG tablet Take 400-600 mg by mouth every 8 (eight) hours as needed. For headache   LORazepam (ATIVAN) 0.5 MG tablet Take 0.5 mg by mouth every 8 (eight) hours as needed. Pt takes half tablet for anxiety.   metoprolol  succinate (TOPROL -XL) 25 MG 24 hr tablet Take 25 mg by mouth daily.   Multiple Vitamin (MULTIVITAMIN) tablet Take 1 tablet by mouth daily.   pantoprazole (PROTONIX) 40 MG tablet Take 40 mg by mouth 2 (two) times daily.   rosuvastatin  (CRESTOR ) 5 MG tablet Take 0.5 tablets (2.5 mg total) by mouth daily 5 times a week.  Take Mon-Fri.   terbinafine (LAMISIL AT) 1 % cream 1 application   valACYclovir (VALTREX) 500 MG tablet Take 500 mg by mouth daily.   vitamin C (ASCORBIC ACID) 500 MG tablet Take 500 mg by mouth daily.   VITAMIN D, ERGOCALCIFEROL, PO Take 1 tablet by mouth daily.   VITAMIN E PO Take 1 tablet by mouth daily.   [DISCONTINUED] rOPINIRole  (REQUIP ) 0.25 MG tablet Take 1 tablet (0.25 mg total) by mouth daily.   FLUoxetine (PROZAC) 20 MG tablet Take 20 mg by mouth 3 (three) times a week. (Patient not taking: Reported on 06/26/2023)   rOPINIRole  (REQUIP ) 0.25 MG  tablet Take 1-2 tablets (0.25-0.5 mg total) by mouth daily.   [DISCONTINUED] aspirin  EC 81 MG tablet Take 1 tablet (81 mg total) by mouth daily. (Patient not taking: Reported on 06/26/2023)   [DISCONTINUED] b complex vitamins tablet Take 1 tablet by mouth daily. (Patient not taking: Reported on 06/26/2023)   [DISCONTINUED] calcium -vitamin D (OSCAL WITH D) 500-200 MG-UNIT per tablet Take 1 tablet by mouth daily. (Patient not taking: Reported on 06/26/2023)   [DISCONTINUED] ciclopirox (PENLAC) 8 % solution Apply topically. (Patient not taking: Reported on 06/26/2023)   No facility-administered encounter medications on file as of 06/26/2023.      PHYSICAL EXAM  GENERAL EXAM/CONSTITUTIONAL: Vitals:  Vitals:   06/26/23 1550  BP: 124/78  Pulse: 72  Weight: 186 lb (84.4 kg)  Height: 5\' 5"  (1.651 m)    Body mass index is 30.95 kg/m. Wt Readings from Last 3 Encounters:  06/26/23 186 lb (  84.4 kg)  06/27/22 176 lb (79.8 kg)  12/22/20 165 lb (74.8 kg)   Patient is in no distress; well developed, nourished and groomed; neck is supple  CARDIOVASCULAR: Examination of carotid arteries is normal; no carotid bruits Regular rate and rhythm, no murmurs Examination of peripheral vascular system by observation and palpation is normal  EYES: Ophthalmoscopic exam of optic discs and posterior segments is normal; no papilledema or hemorrhages No results found.  MUSCULOSKELETAL: Gait, strength, tone, movements noted in Neurologic exam below  NEUROLOGIC: MENTAL STATUS:      No data to display         awake, alert, oriented to person, place and time recent and remote memory intact normal attention and concentration language fluent, comprehension intact, naming intact fund of knowledge appropriate  CRANIAL NERVE:  2nd - no papilledema on fundoscopic exam 2nd, 3rd, 4th, 6th - pupils equal and reactive to light, visual fields full to confrontation, extraocular muscles intact, no nystagmus 5th -  facial sensation symmetric 7th - facial strength symmetric 8th - hearing intact 9th - palate elevates symmetrically, uvula midline 11th - shoulder shrug symmetric 12th - tongue protrusion midline  MOTOR:  normal bulk and tone, full strength in the BUE, BLE  SENSORY:  normal and symmetric to light touch  COORDINATION:  finger-nose-finger, fine finger movements normal  REFLEXES:  deep tendon reflexes present and symmetric  GAIT/STATION:  narrow based gait     DIAGNOSTIC DATA (LABS, IMAGING, TESTING) - I reviewed patient records, labs, notes, testing and imaging myself where available.  Lab Results  Component Value Date   WBC 9.8 09/17/2015   HGB 13.0 09/17/2015   HCT 39.0 09/17/2015   MCV 91.3 09/17/2015   PLT 292 09/17/2015   No results found for: "NA", "K", "CL", "CO2", "GLUCOSE", "BUN", "CREATININE", "CALCIUM ", "PROT", "ALBUMIN", "AST", "ALT", "ALKPHOS", "BILITOT", "GFRNONAA", "GFRAA" No results found for: "CHOL", "HDL", "LDLCALC", "LDLDIRECT", "TRIG", "CHOLHDL" No results found for: "HGBA1C" No results found for: "VITAMINB12" No results found for: "TSH"    ASSESSMENT AND PLAN  63 y.o. year old female here with:  Meds tried: neupro , gabapentin   Dx:  1. Restless leg syndrome      PLAN:  RLS - continue ropirirole 0.25mg ; may take additional tablet for long car / plane trips (caution with compulsive / obsessive behaviors) - check iron levels; then consider iron supplements - mental alerting activities, such as working on a computer or doing crossword puzzles, at times of rest or boredom - moderate regular exercise - avoid caffeine and alcohol - for symptomatic relief - walking, bicycling, soaking the affected limbs, and leg massage, including pneumatic compression  Meds ordered this encounter  Medications   rOPINIRole  (REQUIP ) 0.25 MG tablet    Sig: Take 1-2 tablets (0.25-0.5 mg total) by mouth daily.    Dispense:  120 tablet    Refill:  4    Orders Placed This Encounter  Procedures   Iron, TIBC and Ferritin Panel   Return in about 1 year (around 06/25/2024) for MyChart visit (15 min).    Omega Bible, MD 06/26/2023, 4:38 PM Certified in Neurology, Neurophysiology and Neuroimaging  [facility] [address] [place], [place] [address] [phone]

## 2023-06-27 LAB — IRON,TIBC AND FERRITIN PANEL
Ferritin: 207 ng/mL — ABNORMAL HIGH (ref 15–150)
Iron Saturation: 17 % (ref 15–55)
Iron: 58 ug/dL (ref 27–139)
Total Iron Binding Capacity: 335 ug/dL (ref 250–450)
UIBC: 277 ug/dL (ref 118–369)

## 2023-06-28 ENCOUNTER — Ambulatory Visit: Payer: Self-pay | Admitting: Diagnostic Neuroimaging

## 2023-08-24 DIAGNOSIS — K2970 Gastritis, unspecified, without bleeding: Secondary | ICD-10-CM | POA: Diagnosis not present

## 2023-08-24 DIAGNOSIS — R1013 Epigastric pain: Secondary | ICD-10-CM | POA: Diagnosis not present

## 2023-09-07 DIAGNOSIS — F411 Generalized anxiety disorder: Secondary | ICD-10-CM | POA: Diagnosis not present

## 2023-09-07 DIAGNOSIS — R1013 Epigastric pain: Secondary | ICD-10-CM | POA: Diagnosis not present

## 2023-09-07 DIAGNOSIS — Z6831 Body mass index (BMI) 31.0-31.9, adult: Secondary | ICD-10-CM | POA: Diagnosis not present

## 2023-09-14 ENCOUNTER — Ambulatory Visit (INDEPENDENT_AMBULATORY_CARE_PROVIDER_SITE_OTHER): Admitting: Podiatry

## 2023-09-14 ENCOUNTER — Encounter: Payer: Self-pay | Admitting: Podiatry

## 2023-09-14 DIAGNOSIS — B351 Tinea unguium: Secondary | ICD-10-CM

## 2023-09-14 DIAGNOSIS — B353 Tinea pedis: Secondary | ICD-10-CM

## 2023-09-14 MED ORDER — KETOCONAZOLE 2 % EX CREA
1.0000 | TOPICAL_CREAM | Freq: Every day | CUTANEOUS | 2 refills | Status: DC
Start: 2023-09-14 — End: 2023-11-30

## 2023-09-14 NOTE — Progress Notes (Signed)
  Subjective:  Patient ID: [name] [name], female    DOB: [birth year],   MRN: [id number]  Chief Complaint  Patient presents with   Nail Problem    I need to talk to her about the toenail.  It's been an ongoing thing.   Tinea Pedis    I seem to have itchiness on my left foot on the bottom.    63 y.o. female presents for follow-up of left great toenail fungus. New concern for itching and dryness around her left great toe and into ball and top of her foot. It has been getting worse the last few months and has been trying cream but noting is helping. She is also interested in discussing removal of toenail but not sure when.  Denies any other pedal complaints. Denies n/v/f/c.   Past Medical History:  Diagnosis Date   Anemia    Anxiety    GAD (generalized anxiety disorder)    GERD (gastroesophageal reflux disease)    Grief reaction    Headache(784.0)    History of adenomatous polyp of colon    tubular adenoma 07-31-2015   HTN (hypertension)    Hypercholesterolemia    Hyperlipidemia    Iron deficiency    Left lower quadrant pain    chronic   Moderate recurrent major depression (HCC)    RLS (restless legs syndrome)    Sleep disturbance    Vitamin D deficiency    Wears glasses    White coat syndrome without hypertension     Objective:  Physical Exam: Vascular: DP/PT pulses 2/4 bilateral. CFT <3 seconds. Normal hair growth on digits. No edema.  Skin. No lacerations or abrasions bilateral feet. Left hallux nail thickened and discolored to more than  3/4 of the proximal nail mostly lateral with subungual debris.  Scaling and erythema noted to medial plantar and dorsal first metatarsal head area and extending into left hallux.  Musculoskeletal: MMT 5/5 bilateral lower extremities in DF, PF, Inversion and Eversion. Deceased ROM in DF of ankle joint.  Neurological: Sensation intact to light touch.   Assessment:   1. Tinea pedis of left foot   2. Onychomycosis          Plan:   Patient was evaluated and treated and all questions answered. -Examined patient -Discussed treatment options for painful dystrophic nails  -Cultures does have some mild fungus present as well as some concern for microtrauma.  -Discussed removal of nail in the future at a time convenient for her possibly in January.  Also discussed tinea pedis and treatment options for this.  Ketoconazole  sent to pharmacy to be applied nightly.  Return in 4 weeks for recheck of this.      Asberry Failing, DPM

## 2023-09-26 DIAGNOSIS — M9901 Segmental and somatic dysfunction of cervical region: Secondary | ICD-10-CM | POA: Diagnosis not present

## 2023-09-26 DIAGNOSIS — M7552 Bursitis of left shoulder: Secondary | ICD-10-CM | POA: Diagnosis not present

## 2023-09-26 DIAGNOSIS — M25512 Pain in left shoulder: Secondary | ICD-10-CM | POA: Diagnosis not present

## 2023-09-26 DIAGNOSIS — M79622 Pain in left upper arm: Secondary | ICD-10-CM | POA: Diagnosis not present

## 2023-09-26 DIAGNOSIS — M9902 Segmental and somatic dysfunction of thoracic region: Secondary | ICD-10-CM | POA: Diagnosis not present

## 2023-09-26 DIAGNOSIS — M531 Cervicobrachial syndrome: Secondary | ICD-10-CM | POA: Diagnosis not present

## 2023-09-27 DIAGNOSIS — M9902 Segmental and somatic dysfunction of thoracic region: Secondary | ICD-10-CM | POA: Diagnosis not present

## 2023-09-27 DIAGNOSIS — M9901 Segmental and somatic dysfunction of cervical region: Secondary | ICD-10-CM | POA: Diagnosis not present

## 2023-09-27 DIAGNOSIS — M531 Cervicobrachial syndrome: Secondary | ICD-10-CM | POA: Diagnosis not present

## 2023-09-27 DIAGNOSIS — M7552 Bursitis of left shoulder: Secondary | ICD-10-CM | POA: Diagnosis not present

## 2023-09-27 DIAGNOSIS — M79622 Pain in left upper arm: Secondary | ICD-10-CM | POA: Diagnosis not present

## 2023-09-27 DIAGNOSIS — M25512 Pain in left shoulder: Secondary | ICD-10-CM | POA: Diagnosis not present

## 2023-09-28 DIAGNOSIS — F419 Anxiety disorder, unspecified: Secondary | ICD-10-CM | POA: Diagnosis not present

## 2023-10-02 DIAGNOSIS — M9902 Segmental and somatic dysfunction of thoracic region: Secondary | ICD-10-CM | POA: Diagnosis not present

## 2023-10-02 DIAGNOSIS — M79622 Pain in left upper arm: Secondary | ICD-10-CM | POA: Diagnosis not present

## 2023-10-02 DIAGNOSIS — M7552 Bursitis of left shoulder: Secondary | ICD-10-CM | POA: Diagnosis not present

## 2023-10-02 DIAGNOSIS — M25512 Pain in left shoulder: Secondary | ICD-10-CM | POA: Diagnosis not present

## 2023-10-02 DIAGNOSIS — M531 Cervicobrachial syndrome: Secondary | ICD-10-CM | POA: Diagnosis not present

## 2023-10-02 DIAGNOSIS — M9901 Segmental and somatic dysfunction of cervical region: Secondary | ICD-10-CM | POA: Diagnosis not present

## 2023-10-03 DIAGNOSIS — F419 Anxiety disorder, unspecified: Secondary | ICD-10-CM | POA: Diagnosis not present

## 2023-10-13 ENCOUNTER — Ambulatory Visit: Admitting: Podiatry

## 2023-10-16 DIAGNOSIS — M9901 Segmental and somatic dysfunction of cervical region: Secondary | ICD-10-CM | POA: Diagnosis not present

## 2023-10-16 DIAGNOSIS — M79622 Pain in left upper arm: Secondary | ICD-10-CM | POA: Diagnosis not present

## 2023-10-16 DIAGNOSIS — M25512 Pain in left shoulder: Secondary | ICD-10-CM | POA: Diagnosis not present

## 2023-10-16 DIAGNOSIS — M531 Cervicobrachial syndrome: Secondary | ICD-10-CM | POA: Diagnosis not present

## 2023-10-16 DIAGNOSIS — M9902 Segmental and somatic dysfunction of thoracic region: Secondary | ICD-10-CM | POA: Diagnosis not present

## 2023-10-16 DIAGNOSIS — M7552 Bursitis of left shoulder: Secondary | ICD-10-CM | POA: Diagnosis not present

## 2023-10-17 DIAGNOSIS — M25512 Pain in left shoulder: Secondary | ICD-10-CM | POA: Diagnosis not present

## 2023-10-17 DIAGNOSIS — R1013 Epigastric pain: Secondary | ICD-10-CM | POA: Diagnosis not present

## 2023-10-17 DIAGNOSIS — M79622 Pain in left upper arm: Secondary | ICD-10-CM | POA: Diagnosis not present

## 2023-10-17 DIAGNOSIS — M9902 Segmental and somatic dysfunction of thoracic region: Secondary | ICD-10-CM | POA: Diagnosis not present

## 2023-10-17 DIAGNOSIS — M9901 Segmental and somatic dysfunction of cervical region: Secondary | ICD-10-CM | POA: Diagnosis not present

## 2023-10-17 DIAGNOSIS — M531 Cervicobrachial syndrome: Secondary | ICD-10-CM | POA: Diagnosis not present

## 2023-10-17 DIAGNOSIS — R6881 Early satiety: Secondary | ICD-10-CM | POA: Diagnosis not present

## 2023-10-17 DIAGNOSIS — M7552 Bursitis of left shoulder: Secondary | ICD-10-CM | POA: Diagnosis not present

## 2023-10-18 DIAGNOSIS — M7552 Bursitis of left shoulder: Secondary | ICD-10-CM | POA: Diagnosis not present

## 2023-10-18 DIAGNOSIS — M79622 Pain in left upper arm: Secondary | ICD-10-CM | POA: Diagnosis not present

## 2023-10-18 DIAGNOSIS — F419 Anxiety disorder, unspecified: Secondary | ICD-10-CM | POA: Diagnosis not present

## 2023-10-18 DIAGNOSIS — M25512 Pain in left shoulder: Secondary | ICD-10-CM | POA: Diagnosis not present

## 2023-10-18 DIAGNOSIS — M531 Cervicobrachial syndrome: Secondary | ICD-10-CM | POA: Diagnosis not present

## 2023-10-18 DIAGNOSIS — M9901 Segmental and somatic dysfunction of cervical region: Secondary | ICD-10-CM | POA: Diagnosis not present

## 2023-10-18 DIAGNOSIS — M9902 Segmental and somatic dysfunction of thoracic region: Secondary | ICD-10-CM | POA: Diagnosis not present

## 2023-10-23 DIAGNOSIS — M25512 Pain in left shoulder: Secondary | ICD-10-CM | POA: Diagnosis not present

## 2023-10-23 DIAGNOSIS — M79622 Pain in left upper arm: Secondary | ICD-10-CM | POA: Diagnosis not present

## 2023-10-23 DIAGNOSIS — M9902 Segmental and somatic dysfunction of thoracic region: Secondary | ICD-10-CM | POA: Diagnosis not present

## 2023-10-23 DIAGNOSIS — M7552 Bursitis of left shoulder: Secondary | ICD-10-CM | POA: Diagnosis not present

## 2023-10-23 DIAGNOSIS — M531 Cervicobrachial syndrome: Secondary | ICD-10-CM | POA: Diagnosis not present

## 2023-10-23 DIAGNOSIS — M9901 Segmental and somatic dysfunction of cervical region: Secondary | ICD-10-CM | POA: Diagnosis not present

## 2023-10-24 DIAGNOSIS — M25512 Pain in left shoulder: Secondary | ICD-10-CM | POA: Diagnosis not present

## 2023-10-24 DIAGNOSIS — F419 Anxiety disorder, unspecified: Secondary | ICD-10-CM | POA: Diagnosis not present

## 2023-10-24 DIAGNOSIS — M79622 Pain in left upper arm: Secondary | ICD-10-CM | POA: Diagnosis not present

## 2023-10-24 DIAGNOSIS — M531 Cervicobrachial syndrome: Secondary | ICD-10-CM | POA: Diagnosis not present

## 2023-10-24 DIAGNOSIS — M7552 Bursitis of left shoulder: Secondary | ICD-10-CM | POA: Diagnosis not present

## 2023-10-24 DIAGNOSIS — M9902 Segmental and somatic dysfunction of thoracic region: Secondary | ICD-10-CM | POA: Diagnosis not present

## 2023-10-24 DIAGNOSIS — M9901 Segmental and somatic dysfunction of cervical region: Secondary | ICD-10-CM | POA: Diagnosis not present

## 2023-10-27 ENCOUNTER — Ambulatory Visit: Admitting: Podiatry

## 2023-10-30 DIAGNOSIS — F419 Anxiety disorder, unspecified: Secondary | ICD-10-CM | POA: Diagnosis not present

## 2023-10-31 DIAGNOSIS — I10 Essential (primary) hypertension: Secondary | ICD-10-CM | POA: Diagnosis not present

## 2023-10-31 DIAGNOSIS — Z6831 Body mass index (BMI) 31.0-31.9, adult: Secondary | ICD-10-CM | POA: Diagnosis not present

## 2023-10-31 DIAGNOSIS — F331 Major depressive disorder, recurrent, moderate: Secondary | ICD-10-CM | POA: Diagnosis not present

## 2023-10-31 DIAGNOSIS — B009 Herpesviral infection, unspecified: Secondary | ICD-10-CM | POA: Diagnosis not present

## 2023-10-31 DIAGNOSIS — E7800 Pure hypercholesterolemia, unspecified: Secondary | ICD-10-CM | POA: Diagnosis not present

## 2023-10-31 DIAGNOSIS — F411 Generalized anxiety disorder: Secondary | ICD-10-CM | POA: Diagnosis not present

## 2023-10-31 DIAGNOSIS — R1013 Epigastric pain: Secondary | ICD-10-CM | POA: Diagnosis not present

## 2023-10-31 DIAGNOSIS — G2581 Restless legs syndrome: Secondary | ICD-10-CM | POA: Diagnosis not present

## 2023-11-01 DIAGNOSIS — Z6829 Body mass index (BMI) 29.0-29.9, adult: Secondary | ICD-10-CM | POA: Diagnosis not present

## 2023-11-01 DIAGNOSIS — Z1231 Encounter for screening mammogram for malignant neoplasm of breast: Secondary | ICD-10-CM | POA: Diagnosis not present

## 2023-11-01 DIAGNOSIS — Z01419 Encounter for gynecological examination (general) (routine) without abnormal findings: Secondary | ICD-10-CM | POA: Diagnosis not present

## 2023-11-26 ENCOUNTER — Other Ambulatory Visit: Payer: Self-pay | Admitting: Diagnostic Neuroimaging

## 2023-11-27 DIAGNOSIS — F419 Anxiety disorder, unspecified: Secondary | ICD-10-CM | POA: Diagnosis not present

## 2023-11-27 DIAGNOSIS — M79622 Pain in left upper arm: Secondary | ICD-10-CM | POA: Diagnosis not present

## 2023-11-27 DIAGNOSIS — M9901 Segmental and somatic dysfunction of cervical region: Secondary | ICD-10-CM | POA: Diagnosis not present

## 2023-11-27 DIAGNOSIS — M9902 Segmental and somatic dysfunction of thoracic region: Secondary | ICD-10-CM | POA: Diagnosis not present

## 2023-11-27 DIAGNOSIS — M531 Cervicobrachial syndrome: Secondary | ICD-10-CM | POA: Diagnosis not present

## 2023-11-27 DIAGNOSIS — M25512 Pain in left shoulder: Secondary | ICD-10-CM | POA: Diagnosis not present

## 2023-11-27 DIAGNOSIS — M7552 Bursitis of left shoulder: Secondary | ICD-10-CM | POA: Diagnosis not present

## 2023-11-28 DIAGNOSIS — K3189 Other diseases of stomach and duodenum: Secondary | ICD-10-CM | POA: Diagnosis not present

## 2023-11-28 DIAGNOSIS — R6881 Early satiety: Secondary | ICD-10-CM | POA: Diagnosis not present

## 2023-11-28 DIAGNOSIS — R1013 Epigastric pain: Secondary | ICD-10-CM | POA: Diagnosis not present

## 2023-11-30 ENCOUNTER — Ambulatory Visit: Admitting: Podiatry

## 2023-11-30 ENCOUNTER — Encounter: Payer: Self-pay | Admitting: Podiatry

## 2023-11-30 DIAGNOSIS — R7303 Prediabetes: Secondary | ICD-10-CM | POA: Diagnosis not present

## 2023-11-30 DIAGNOSIS — Z6831 Body mass index (BMI) 31.0-31.9, adult: Secondary | ICD-10-CM | POA: Diagnosis not present

## 2023-11-30 DIAGNOSIS — Z23 Encounter for immunization: Secondary | ICD-10-CM | POA: Diagnosis not present

## 2023-11-30 DIAGNOSIS — I10 Essential (primary) hypertension: Secondary | ICD-10-CM | POA: Diagnosis not present

## 2023-11-30 DIAGNOSIS — B351 Tinea unguium: Secondary | ICD-10-CM | POA: Diagnosis not present

## 2023-11-30 DIAGNOSIS — B009 Herpesviral infection, unspecified: Secondary | ICD-10-CM | POA: Diagnosis not present

## 2023-11-30 DIAGNOSIS — G2581 Restless legs syndrome: Secondary | ICD-10-CM | POA: Diagnosis not present

## 2023-11-30 DIAGNOSIS — R7301 Impaired fasting glucose: Secondary | ICD-10-CM | POA: Diagnosis not present

## 2023-11-30 DIAGNOSIS — F331 Major depressive disorder, recurrent, moderate: Secondary | ICD-10-CM | POA: Diagnosis not present

## 2023-11-30 DIAGNOSIS — E7800 Pure hypercholesterolemia, unspecified: Secondary | ICD-10-CM | POA: Diagnosis not present

## 2023-11-30 DIAGNOSIS — B353 Tinea pedis: Secondary | ICD-10-CM

## 2023-11-30 DIAGNOSIS — R1013 Epigastric pain: Secondary | ICD-10-CM | POA: Diagnosis not present

## 2023-11-30 DIAGNOSIS — Z0000 Encounter for general adult medical examination without abnormal findings: Secondary | ICD-10-CM | POA: Diagnosis not present

## 2023-11-30 DIAGNOSIS — F411 Generalized anxiety disorder: Secondary | ICD-10-CM | POA: Diagnosis not present

## 2023-11-30 MED ORDER — AMMONIUM LACTATE 12 % EX CREA: 1.0000 | TOPICAL_CREAM | CUTANEOUS | 0 refills | Status: AC | PRN

## 2023-11-30 MED ORDER — KETOCONAZOLE 2 % EX CREA: 1.0000 | TOPICAL_CREAM | Freq: Every day | CUTANEOUS | 2 refills | Status: AC

## 2023-11-30 NOTE — Progress Notes (Signed)
  Subjective:  Patient ID: [name] [name], female    DOB: [birth year],   MRN: [id number]  Chief Complaint  Patient presents with   Tinea Pedis    It's okay.    63 y.o. female presents for follow-up of left great toenail fungus infection and tinea pedis. Relates doing well and actually getting improvement in the nail. Some improvement around the skin. Has been using ketoconazole .  Denies any other pedal complaints. Denies n/v/f/c.   Past Medical History:  Diagnosis Date   Anemia    Anxiety    GAD (generalized anxiety disorder)    GERD (gastroesophageal reflux disease)    Grief reaction    Headache(784.0)    History of adenomatous polyp of colon    tubular adenoma 07-31-2015   HTN (hypertension)    Hypercholesterolemia    Hyperlipidemia    Iron deficiency    Left lower quadrant pain    chronic   Moderate recurrent major depression (HCC)    RLS (restless legs syndrome)    Sleep disturbance    Vitamin D deficiency    Wears glasses    White coat syndrome without hypertension     Objective:  Physical Exam: Vascular: DP/PT pulses 2/4 bilateral. CFT <3 seconds. Normal hair growth on digits. No edema.  Skin. No lacerations or abrasions bilateral feet. Left hallux nail thickened and discolored to distal 1/8 of the proximal nail mostly lateral with subungual debris.  Scaling and erythema noted to medial plantar and dorsal first metatarsal head area and extending into left hallux.  Improved Musculoskeletal: MMT 5/5 bilateral lower extremities in DF, PF, Inversion and Eversion. Deceased ROM in DF of ankle joint.  Neurological: Sensation intact to light touch.   Assessment:   No diagnosis found.        Plan:  Patient was evaluated and treated and all questions answered. -Examined patient -Discussed treatment options for painful dystrophic nails  -Cultures does have some mild fungus present as well as some concern for microtrauma.  - Discussed nail seems to be improved.  Do  not feel like this is related to nail at this time. Will continue fluconazole for now, likely to help with dryness and pain. Ketoconazole  sent to pharmacy to be applied nightly.  Return in as needed     Asberry Failing, DPM

## 2023-12-04 DIAGNOSIS — F419 Anxiety disorder, unspecified: Secondary | ICD-10-CM | POA: Diagnosis not present

## 2023-12-11 DIAGNOSIS — F419 Anxiety disorder, unspecified: Secondary | ICD-10-CM | POA: Diagnosis not present

## 2023-12-18 DIAGNOSIS — F419 Anxiety disorder, unspecified: Secondary | ICD-10-CM | POA: Diagnosis not present

## 2023-12-19 DIAGNOSIS — L308 Other specified dermatitis: Secondary | ICD-10-CM | POA: Diagnosis not present

## 2023-12-19 DIAGNOSIS — L710 Perioral dermatitis: Secondary | ICD-10-CM | POA: Diagnosis not present

## 2023-12-19 DIAGNOSIS — L821 Other seborrheic keratosis: Secondary | ICD-10-CM | POA: Diagnosis not present

## 2023-12-19 DIAGNOSIS — L814 Other melanin hyperpigmentation: Secondary | ICD-10-CM | POA: Diagnosis not present

## 2023-12-19 DIAGNOSIS — D1801 Hemangioma of skin and subcutaneous tissue: Secondary | ICD-10-CM | POA: Diagnosis not present

## 2024-01-22 ENCOUNTER — Ambulatory Visit: Admitting: Podiatry

## 2024-01-23 ENCOUNTER — Ambulatory Visit: Admitting: Podiatry

## 2024-07-01 ENCOUNTER — Ambulatory Visit: Admitting: Diagnostic Neuroimaging
